# Patient Record
Sex: Male | Born: 1960 | Race: White | Hispanic: No | Marital: Married | Smoking: Never smoker
Health system: Southern US, Community
[De-identification: ages and names within clinical notes are randomized; demographics above are authoritative.]

## PROBLEM LIST (undated history)

## (undated) DIAGNOSIS — I10 Essential (primary) hypertension: Secondary | ICD-10-CM

## (undated) DIAGNOSIS — D509 Iron deficiency anemia, unspecified: Secondary | ICD-10-CM

## (undated) DIAGNOSIS — K2270 Barrett's esophagus without dysplasia: Secondary | ICD-10-CM

## (undated) DIAGNOSIS — R519 Headache, unspecified: Secondary | ICD-10-CM

## (undated) DIAGNOSIS — Z87442 Personal history of urinary calculi: Secondary | ICD-10-CM

## (undated) DIAGNOSIS — K219 Gastro-esophageal reflux disease without esophagitis: Secondary | ICD-10-CM

## (undated) DIAGNOSIS — N2889 Other specified disorders of kidney and ureter: Secondary | ICD-10-CM

## (undated) DIAGNOSIS — M1990 Unspecified osteoarthritis, unspecified site: Secondary | ICD-10-CM

---

## 2000-10-02 HISTORY — PX: KNEE ARTHROSCOPY: SHX127

## 2003-10-03 HISTORY — PX: GASTRIC BYPASS: SHX52

## 2004-07-02 HISTORY — PX: ROUX-EN-Y GASTRIC BYPASS: SHX1104

## 2006-10-02 HISTORY — PX: OTHER SURGICAL HISTORY: SHX169

## 2018-10-02 HISTORY — PX: COLONOSCOPY WITH ESOPHAGOGASTRODUODENOSCOPY (EGD): SHX5779

## 2019-05-28 DIAGNOSIS — Z9884 Bariatric surgery status: Secondary | ICD-10-CM | POA: Insufficient documentation

## 2019-05-28 DIAGNOSIS — D509 Iron deficiency anemia, unspecified: Secondary | ICD-10-CM | POA: Insufficient documentation

## 2019-05-28 DIAGNOSIS — Z8719 Personal history of other diseases of the digestive system: Secondary | ICD-10-CM | POA: Insufficient documentation

## 2019-05-28 DIAGNOSIS — I10 Essential (primary) hypertension: Secondary | ICD-10-CM | POA: Insufficient documentation

## 2019-06-24 LAB — HM COLONOSCOPY

## 2019-12-19 ENCOUNTER — Ambulatory Visit: Payer: Self-pay | Attending: Internal Medicine

## 2019-12-19 DIAGNOSIS — Z23 Encounter for immunization: Secondary | ICD-10-CM

## 2019-12-19 NOTE — Progress Notes (Signed)
   Covid-19 Vaccination Clinic  Name:  Colton Butler    MRN: [id number] DOB: 03/12/1961  12/19/2019  Mr. Colton Butler was observed post Covid-19 immunization for 15 minutes without incident. He was provided with Vaccine Information Sheet and instruction to access the V-Safe system.   Mr. Colton Butler was instructed to call 911 with any severe reactions post vaccine: Marland Kitchen Difficulty breathing  . Swelling of face and throat  . A fast heartbeat  . A bad rash all over body  . Dizziness and weakness   Immunizations Administered    Name Date Dose VIS Date Route   Pfizer COVID-19 Vaccine 12/19/2019  9:50 AM 0.3 mL 09/12/2019 Intramuscular   Manufacturer: [place]   Lot: [id number]   [place]: [id number]

## 2020-01-13 ENCOUNTER — Ambulatory Visit: Payer: Self-pay | Attending: Internal Medicine

## 2020-01-13 DIAGNOSIS — Z23 Encounter for immunization: Secondary | ICD-10-CM

## 2020-01-13 NOTE — Progress Notes (Signed)
   Covid-19 Vaccination Clinic  Name:  Zildjian Gandia    MRN: [id number] DOB: 1960/12/21  01/13/2020  Mr. Leiby was observed post Covid-19 immunization for 15 minutes without incident. He was provided with Vaccine Information Sheet and instruction to access the V-Safe system.   Mr. Seat was instructed to call 911 with any severe reactions post vaccine: Marland Kitchen Difficulty breathing  . Swelling of face and throat  . A fast heartbeat  . A bad rash all over body  . Dizziness and weakness   Immunizations Administered    Name Date Dose VIS Date Route   Pfizer COVID-19 Vaccine 01/13/2020  9:45 AM 0.3 mL 09/12/2019 Intramuscular   Manufacturer: [place]   Lot: [id number]   [place]: [id number]

## 2020-07-07 ENCOUNTER — Ambulatory Visit: Payer: Self-pay

## 2021-10-24 ENCOUNTER — Ambulatory Visit (INDEPENDENT_AMBULATORY_CARE_PROVIDER_SITE_OTHER): Payer: 59

## 2021-10-24 ENCOUNTER — Other Ambulatory Visit: Payer: Self-pay

## 2021-10-24 ENCOUNTER — Ambulatory Visit
Admission: RE | Admit: 2021-10-24 | Discharge: 2021-10-24 | Disposition: A | Discharge status: Home / Self Care | Payer: 59 | Source: Ambulatory Visit

## 2021-10-24 VITALS — BP 150/96 | HR 81 | Temp 98.6°F | Resp 18 | Ht 72.0 in | Wt 285.0 lb

## 2021-10-24 DIAGNOSIS — R059 Cough, unspecified: Secondary | ICD-10-CM | POA: Diagnosis not present

## 2021-10-24 DIAGNOSIS — J40 Bronchitis, not specified as acute or chronic: Secondary | ICD-10-CM

## 2021-10-24 DIAGNOSIS — R0989 Other specified symptoms and signs involving the circulatory and respiratory systems: Secondary | ICD-10-CM | POA: Diagnosis not present

## 2021-10-24 MED ORDER — ALBUTEROL SULFATE HFA 108 (90 BASE) MCG/ACT IN AERS: 2.0000 | INHALATION_SPRAY | RESPIRATORY_TRACT | 0 refills | Status: DC | PRN

## 2021-10-24 MED ORDER — PROMETHAZINE-DM 6.25-15 MG/5ML PO SYRP: 5.0000 mL | ORAL_SOLUTION | Freq: Four times a day (QID) | ORAL | 0 refills | Status: DC | PRN

## 2021-10-24 MED ORDER — PREDNISONE 20 MG PO TABS: 60.0000 mg | ORAL_TABLET | Freq: Every day | ORAL | 0 refills | Status: AC

## 2021-10-24 MED ORDER — AEROCHAMBER MV MISC: 2 refills | Status: DC

## 2021-10-24 MED ORDER — BENZONATATE 100 MG PO CAPS: 200.0000 mg | ORAL_CAPSULE | Freq: Three times a day (TID) | ORAL | 0 refills | Status: DC

## 2021-10-24 NOTE — ED Provider Notes (Signed)
[facility]    CSN: [id number] Arrival date & time: 10/24/21  0844      History   Chief Complaint Chief Complaint  Patient presents with   Cough    9 am appointment    HPI Wai Litt is a 61 y.o. male.   HPI  61 year old male here for evaluation of respiratory complaints.  Patient reports that he has been experiencing a cough for the past 3 weeks that became productive in the last 2 days for a green-yellow sputum.  He also reports that he has wheezing when he lays down, some shortness of breath, fevers that wax and wane to approximately 99, nasal congestion, and decreased appetite.  He is also been experiencing some diarrhea.  Per his wife he was nauseous on the first day but that has largely subsided though she is concerned about his decreased appetite.  History reviewed. No pertinent past medical history.  There are no problems to display for this patient.   Past Surgical History:  Procedure Laterality Date   GASTRIC BYPASS  2005       Home Medications    Prior to Admission medications   Medication Sig Start Date End Date Taking? Authorizing Provider  albuterol (VENTOLIN HFA) 108 (90 Base) MCG/ACT inhaler Inhale 2 puffs into the lungs every 4 (four) hours as needed. 10/24/21  Yes Margarette Canada, NP  benzonatate (TESSALON) 100 MG capsule Take 2 capsules (200 mg total) by mouth every 8 (eight) hours. 10/24/21  Yes Margarette Canada, NP  promethazine-dextromethorphan (PROMETHAZINE-DM) 6.25-15 MG/5ML syrup Take 5 mLs by mouth 4 (four) times daily as needed. 10/24/21  Yes Margarette Canada, NP  Spacer/Aero-Holding Chambers (AEROCHAMBER MV) inhaler Use as instructed 10/24/21  Yes Margarette Canada, NP  lisinopril (ZESTRIL) 20 MG tablet Take 20 mg by mouth daily. 07/25/21   [provider]    Family History History reviewed. No pertinent family history.  Social History Social History   Tobacco Use   Smoking status: Never   Smokeless tobacco: Never  Vaping Use    Vaping Use: Never used  Substance Use Topics   Alcohol use: Not Currently   Drug use: Not Currently     Allergies   Patient has no allergy information on record.   Review of Systems Review of Systems  Constitutional:  Positive for appetite change and fever.  HENT:  Positive for congestion and rhinorrhea. Negative for ear pain and sore throat.   Respiratory:  Positive for cough, shortness of breath and wheezing.   Gastrointestinal:  Positive for diarrhea and nausea. Negative for vomiting.  Skin:  Negative for rash.  Hematological: Negative.   Psychiatric/Behavioral: Negative.      Physical Exam Triage Vital Signs ED Triage Vitals  Enc Vitals Group     BP 10/24/21 0913 (!) 150/96     Pulse Rate 10/24/21 0913 81     Resp 10/24/21 0913 18     Temp 10/24/21 0913 98.6 F (37 C)     Temp Source 10/24/21 0913 Oral     SpO2 10/24/21 0913 98 %     Weight 10/24/21 0911 285 lb (129.3 kg)     Height 10/24/21 0911 6' (1.829 m)     Head Circumference --      Peak Flow --      Pain Score 10/24/21 0911 0     Pain Loc --      Pain Edu? --      Excl. in [place]? --  No data found.  Updated Vital Signs BP (!) 150/96 (BP Location: Left Arm)    Pulse 81    Temp 98.6 F (37 C) (Oral)    Resp 18    Ht 6' (1.829 m)    Wt 285 lb (129.3 kg)    SpO2 98%    BMI 38.65 kg/m   Visual Acuity Right Eye Distance:   Left Eye Distance:   Bilateral Distance:    Right Eye Near:   Left Eye Near:    Bilateral Near:     Physical Exam Vitals and nursing note reviewed.  Constitutional:      General: He is not in acute distress.    Appearance: Normal appearance. He is not ill-appearing.  HENT:     Head: Normocephalic and atraumatic.     Right Ear: Tympanic membrane, ear canal and external ear normal. There is no impacted cerumen.     Left Ear: Tympanic membrane, ear canal and external ear normal. There is no impacted cerumen.     Nose: Congestion present. No rhinorrhea.     Mouth/Throat:      Mouth: Mucous membranes are moist.     Pharynx: Oropharynx is clear. No posterior oropharyngeal erythema.  Cardiovascular:     Rate and Rhythm: Normal rate and regular rhythm.     Pulses: Normal pulses.     Heart sounds: Normal heart sounds. No murmur heard.   No friction rub. No gallop.  Pulmonary:     Effort: Pulmonary effort is normal.     Breath sounds: Wheezing and rhonchi present. No rales.  Musculoskeletal:     Cervical back: Normal range of motion and neck supple.  Lymphadenopathy:     Cervical: No cervical adenopathy.  Skin:    General: Skin is warm and dry.     Capillary Refill: Capillary refill takes less than 2 seconds.     Findings: No erythema or rash.  Neurological:     General: No focal deficit present.     Mental Status: He is alert and oriented to person, place, and time.  Psychiatric:        Mood and Affect: Mood normal.        Behavior: Behavior normal.        Thought Content: Thought content normal.        Judgment: Judgment normal.     UC Treatments / Results  Labs (all labs ordered are listed, but only abnormal results are displayed) Labs Reviewed - No data to display  EKG   Radiology DG Chest 2 View  Result Date: 10/24/2021 CLINICAL DATA:  Cough and chest congestion.  Weeks EXAM: CHEST - 2 VIEW COMPARISON:  None. FINDINGS: The cardiomediastinal silhouette is within normal limits. No airspace consolidation, edema, pleural effusion, or pneumothorax is identified. No acute osseous abnormality is seen. IMPRESSION: No active cardiopulmonary disease. Electronically Signed   By: Logan Bores M.D.   On: 10/24/2021 10:58    Procedures Procedures (including critical care time)  Medications Ordered in UC Medications - No data to display  Initial Impression / Assessment and Plan / UC Course  I have reviewed the triage vital signs and the nursing notes.  Pertinent labs & imaging results that were available during my care of the patient were reviewed by me  and considered in my medical decision making (see chart for details).  Patient is a very pleasant, nontoxic appearing 61 year old male here for evaluation of 3 weeks worth of respiratory complaints.  He  does endorse some nasal congestion but his primary complaint is a cough that has been present for 3 weeks with shortness of breath and wheezing when he lays down.  2 days ago he started to produce sputum that is green to yellow in color.  During the course of his illness he has also been experiencing waxing and waning fevers but reports that the maximum measured temp at home was 99.  He also experienced nausea on the first day, has had some ongoing diarrhea, and has had a decreased appetite.  Patient's wife is most worried about the appetite and reports that she is only been able to get him to eat eat more than soup in the past 2 days.  Patient's physical exam reveals pearly gray tympanic membranes bilaterally with normal light reflex and clear external auditory canals.  Nasal mucosa is mildly edematous and erythematous without any significant discharge.  Oropharyngeal exam is benign.  No cervical lymphadenopathy present on exam.  Cardiopulmonary exam reveals rhonchi in bilateral upper lung fields.  The remainder the right lung field is clear but patient does have wheezes and rhonchi in the left lower lobe posteriorly.  Patient also has a prolonged expiratory phase.  Patient was tested at home x3 for COVID and all the tests were negative.  I explained to the patient and his wife that there is a chance that he could have had a false negative test but he is beyond the window for therapeutic intervention and he is no longer contagious.  My concern is a secondary bacterial infection and possible pneumonia.  Will obtain chest x-ray to look for acute cardiothoracic process.  Chest x-ray independently reviewed and evaluated by me.  Impression: Lung fields are well pneumatized.  There is questionable patchiness in the  left lower lobe.  Pulmonary vasculature is prominent.  Radiology impression is pending.  Radiology impression states cardiomediastinal silhouette is within normal limits.  No airspace consolidation, edema, pleural effusion, or pneumothorax is noted.  No osseous abnormality.  No active cardiopulmonary disease.  Suspect patient has developed bronchitis secondary to his upper respiratory infection that involves the past 3 weeks.  We will treat patient with an albuterol inhaler to help with wheezing and shortness of breath, prednisone, Tessalon Perles, and Promethazine DM cough syrup.   Final Clinical Impressions(s) / UC Diagnoses   Final diagnoses:  Bronchitis     Discharge Instructions      Use the albuterol inhaler/nebulizer every 4-6 hours as needed for shortness of breath, wheezing, and cough.  Take the prednisone according to the package instructions to help with pulmonary inflammation.  Use the Tessalon Perles every 8 hours for your cough.  Taken with a small sip of water.  They may give you some numbness to the base of your tongue or metallic taste in her mouth, this is normal.  They are designed to calm down the cough reflex.  Use the Promethazine DM cough syrup at bedtime as will make you drowsy.  You may take 1 teaspoon (5 mL) every 6 hours.  Return for reevaluation for new or worsening symptoms.      ED Prescriptions     Medication Sig Dispense Auth. Provider   albuterol (VENTOLIN HFA) 108 (90 Base) MCG/ACT inhaler Inhale 2 puffs into the lungs every 4 (four) hours as needed. 18 g Margarette Canada, NP   Spacer/Aero-Holding Chambers (AEROCHAMBER MV) inhaler Use as instructed 1 each Margarette Canada, NP   benzonatate (TESSALON) 100 MG capsule Take 2 capsules (200  mg total) by mouth every 8 (eight) hours. 21 capsule Margarette Canada, NP   promethazine-dextromethorphan (PROMETHAZINE-DM) 6.25-15 MG/5ML syrup Take 5 mLs by mouth 4 (four) times daily as needed. 118 mL Margarette Canada, NP       PDMP not reviewed this encounter.   Margarette Canada, NP 10/24/21 1143

## 2021-10-24 NOTE — ED Triage Notes (Signed)
Pt here with C/O chest congestion for 3 weeks. Just became a productive cough a couple days ago, pt states he is wheezing when laying on the left side.

## 2021-10-24 NOTE — Discharge Instructions (Addendum)
Use the albuterol inhaler/nebulizer every 4-6 hours as needed for shortness of breath, wheezing, and cough.  Take the prednisone according to the package instructions to help with pulmonary inflammation.  Use the Tessalon Perles every 8 hours for your cough.  Taken with a small sip of water.  They may give you some numbness to the base of your tongue or metallic taste in her mouth, this is normal.  They are designed to calm down the cough reflex.  Use the Promethazine DM cough syrup at bedtime as will make you drowsy.  You may take 1 teaspoon (5 mL) every 6 hours.  Return for reevaluation for new or worsening symptoms.  

## 2022-01-29 ENCOUNTER — Emergency Department
Admission: EM | Admit: 2022-01-29 | Discharge: 2022-01-30 | Disposition: A | Discharge status: Home / Self Care | Payer: 59 | Attending: Emergency Medicine | Admitting: Emergency Medicine

## 2022-01-29 ENCOUNTER — Encounter: Payer: Self-pay | Admitting: Emergency Medicine

## 2022-01-29 ENCOUNTER — Emergency Department: Payer: 59

## 2022-01-29 DIAGNOSIS — N201 Calculus of ureter: Secondary | ICD-10-CM | POA: Insufficient documentation

## 2022-01-29 DIAGNOSIS — I10 Essential (primary) hypertension: Secondary | ICD-10-CM | POA: Insufficient documentation

## 2022-01-29 DIAGNOSIS — N23 Unspecified renal colic: Secondary | ICD-10-CM

## 2022-01-29 DIAGNOSIS — R339 Retention of urine, unspecified: Secondary | ICD-10-CM | POA: Diagnosis present

## 2022-01-29 HISTORY — DX: Essential (primary) hypertension: I10

## 2022-01-29 LAB — CBC WITH DIFFERENTIAL/PLATELET
Abs Immature Granulocytes: 0.02 10*3/uL (ref 0.00–0.07)
Basophils Absolute: 0.1 10*3/uL (ref 0.0–0.1)
Basophils Relative: 1 %
Eosinophils Absolute: 0.3 10*3/uL (ref 0.0–0.5)
Eosinophils Relative: 4 %
HCT: 44.8 % (ref 39.0–52.0)
Hemoglobin: 15.5 g/dL (ref 13.0–17.0)
Immature Granulocytes: 0 %
Lymphocytes Relative: 42 %
Lymphs Abs: 4.1 10*3/uL — ABNORMAL HIGH (ref 0.7–4.0)
MCH: 32.1 pg (ref 26.0–34.0)
MCHC: 34.6 g/dL (ref 30.0–36.0)
MCV: 92.8 fL (ref 80.0–100.0)
Monocytes Absolute: 1 10*3/uL (ref 0.1–1.0)
Monocytes Relative: 10 %
Neutro Abs: 4.3 10*3/uL (ref 1.7–7.7)
Neutrophils Relative %: 43 %
Platelets: 290 10*3/uL (ref 150–400)
RBC: 4.83 MIL/uL (ref 4.22–5.81)
RDW: 12.4 % (ref 11.5–15.5)
WBC: 9.7 10*3/uL (ref 4.0–10.5)
nRBC: 0 % (ref 0.0–0.2)

## 2022-01-29 LAB — URINALYSIS, ROUTINE W REFLEX MICROSCOPIC
Bilirubin Urine: NEGATIVE
Glucose, UA: NEGATIVE mg/dL
Hgb urine dipstick: NEGATIVE
Ketones, ur: NEGATIVE mg/dL
Leukocytes,Ua: NEGATIVE
Nitrite: NEGATIVE
Protein, ur: NEGATIVE mg/dL
Specific Gravity, Urine: 1.015 (ref 1.005–1.030)
pH: 5 (ref 5.0–8.0)

## 2022-01-29 LAB — COMPREHENSIVE METABOLIC PANEL
ALT: 24 U/L (ref 0–44)
AST: 30 U/L (ref 15–41)
Albumin: 4.7 g/dL (ref 3.5–5.0)
Alkaline Phosphatase: 79 U/L (ref 38–126)
Anion gap: 10 (ref 5–15)
BUN: 17 mg/dL (ref 8–23)
CO2: 21 mmol/L — ABNORMAL LOW (ref 22–32)
Calcium: 9.7 mg/dL (ref 8.9–10.3)
Chloride: 103 mmol/L (ref 98–111)
Creatinine, Ser: 1.22 mg/dL (ref 0.61–1.24)
GFR, Estimated: >60 mL/min (ref >60)
Glucose, Bld: 106 mg/dL — ABNORMAL HIGH (ref 70–99)
Potassium: 4 mmol/L (ref 3.5–5.1)
Sodium: 134 mmol/L — ABNORMAL LOW (ref 135–145)
Total Bilirubin: 0.8 mg/dL (ref 0.3–1.2)
Total Protein: 8.2 g/dL — ABNORMAL HIGH (ref 6.5–8.1)

## 2022-01-29 MED ORDER — KETOROLAC TROMETHAMINE 30 MG/ML IJ SOLN: 30.0000 mg | Freq: Once | INTRAMUSCULAR | Status: DC

## 2022-01-29 MED ORDER — KETOROLAC TROMETHAMINE 30 MG/ML IJ SOLN
15.0000 mg | Freq: Once | INTRAMUSCULAR | Status: AC
  Administered 2022-01-29: 15 mg via INTRAVENOUS

## 2022-01-29 MED ORDER — LACTATED RINGERS IV BOLUS
1000.0000 mL | Freq: Once | INTRAVENOUS | Status: AC
  Administered 2022-01-29: 1000 mL via INTRAVENOUS

## 2022-01-29 MED ORDER — HYDROMORPHONE HCL 1 MG/ML IJ SOLN
1.0000 mg | Freq: Once | INTRAMUSCULAR | Status: AC
  Administered 2022-01-29: 1 mg via INTRAVENOUS
  Filled 2022-01-29: qty 1

## 2022-01-29 MED ORDER — KETOROLAC TROMETHAMINE 15 MG/ML IJ SOLN
INTRAMUSCULAR | Status: AC
  Filled 2022-01-29: qty 1

## 2022-01-29 NOTE — ED Provider Notes (Signed)
? ?[employer] ?Provider Note ? ? ? Event Date/Time  ? First MD Initiated Contact with Patient 01/29/22 2102   ?  (approximate) ? ? ?History  ? ?Urinary Retention ? ? ?HPI ? ?Colton Butler is a 61 y.o. male who presents to the ED for evaluation of Urinary Retention ?  ?Patient self-reports a history of hypertension, GERD and s/p Roux-en-Y gastric bypass remotely. ? ?He presents with his wife for evaluation of suprapubic and left-sided flank pain in the setting of difficulty voiding.  He reports frequently having urinary incontinence that he attributes to getting older, but has not ever had retention, prostate issues or need for a catheter.  Reports symptoms just developing this afternoon, since about 2 PM.  Denies any dysuria, urethral discharge.  Reports just dribbling urine when he tries to void.  Has never had a kidney stone. ? ? ?Physical Exam  ? ?Triage Vital Signs: ?ED Triage Vitals  ?Enc Vitals Group  ?   BP 01/29/22 2050 (!) 147/119  ?   Pulse Rate 01/29/22 2050 (!) 101  ?   Resp 01/29/22 2050 18  ?   Temp 01/29/22 2050 98.4 ?F (36.9 ?C)  ?   Temp src --   ?   SpO2 01/29/22 2050 95 %  ?   Weight 01/29/22 2050 300 lb (136.1 kg)  ?   Height 01/29/22 2050 6' (1.829 m)  ?   Head Circumference --   ?   Peak Flow --   ?   Pain Score 01/29/22 2050 6  ?   Pain Loc --   ?   Pain Edu? --   ?   Excl. in [place]? --   ? ? ?Most recent vital signs: ?Vitals:  ? 01/29/22 2050  ?BP: (!) 147/119  ?Pulse: (!) 101  ?Resp: 18  ?Temp: 98.4 ?F (36.9 ?C)  ?SpO2: 95%  ? ? ?General: Awake, no distress.  Obese.  Walks back from the bathroom where he just tried to void, seemed uncomfortable.  Sitting up in the side of the bed.  Left CVA tenderness is present. ?CV:  Good peripheral perfusion.  ?Resp:  Normal effort.  ?Abd:  No distention.  Suprapubic tenderness and left-sided CVA tenderness is present.  Benign upper abdomen. ?MSK:  No deformity noted.  ?Neuro:  No focal deficits appreciated. ?Other:   ? ? ?ED Results /  Procedures / Treatments  ? ?Labs ?(all labs ordered are listed, but only abnormal results are displayed) ?Labs Reviewed  ?URINALYSIS, ROUTINE W REFLEX MICROSCOPIC - Abnormal; Notable for the following components:  ?    Result Value  ? Color, Urine YELLOW (*)   ? APPearance CLEAR (*)   ? All other components within normal limits  ?COMPREHENSIVE METABOLIC PANEL - Abnormal; Notable for the following components:  ? Sodium 134 (*)   ? CO2 21 (*)   ? Glucose, Bld 106 (*)   ? Total Protein 8.2 (*)   ? All other components within normal limits  ?CBC WITH DIFFERENTIAL/PLATELET - Abnormal; Notable for the following components:  ? Lymphs Abs 4.1 (*)   ? All other components within normal limits  ? ? ?EKG ? ? ?RADIOLOGY ?CT abdomen/pelvis reviewed by me with thickened bladder wall, left perinephric stranding, left proximal hydroureter without ureteral stone ? ?Official radiology report(s): ?CT ABDOMEN PELVIS WO CONTRAST ? ?Result Date: 01/29/2022 ?CLINICAL DATA:  Left flank pain, suprapubic pain EXAM: CT ABDOMEN AND PELVIS WITHOUT CONTRAST TECHNIQUE: Multidetector CT imaging of  the abdomen and pelvis was performed following the standard protocol without IV contrast. RADIATION DOSE REDUCTION: This exam was performed according to the departmental dose-optimization program which includes automated exposure control, adjustment of the mA and/or kV according to patient size and/or use of iterative reconstruction technique. COMPARISON:  None. FINDINGS: Lower chest: Small linear densities in the lower lung fields may suggest minimal scarring or subsegmental atelectasis. Hepatobiliary: Liver measures 20.7 cm in length. No focal abnormality is seen. There are multiple calcified gallbladder stones. There is no wall thickening in gallbladder. Pancreas: No focal abnormality is seen. Spleen: Unremarkable. Adrenals/Urinary Tract: Adrenals are unremarkable. There is no hydronephrosis in the right kidney. There is mild to moderate left  hydronephrosis. There is dilation of left ureter. There is no identifiable opaque left ureteral stone. Findings may suggest recent passage of left ureteral stone. Another less likely possibility would be left vesical renal reflux. There are no demonstrable renal stones. There is 1.3 cm exophytic lesion in the lower pole of left kidney. Density measurements are higher than usual for simple cyst. There is possible subcentimeter cyst in the upper pole of right kidney. There is mild diffuse wall thickening in the bladder. There is no definite opaque calculus in the bladder lumen. Stomach/Bowel: Surgical staples are seen in the stomach. Small hiatal hernia is seen. Small bowel loops are not dilated. Appendix is not dilated. There is no significant wall thickening in the colon. There is no pericolic stranding. Vascular/Lymphatic: Scattered arterial calcifications are seen. Reproductive: Unremarkable. Other: There is no ascites or pneumoperitoneum. Musculoskeletal: Unremarkable. IMPRESSION: There is mild to moderate left hydronephrosis. There is left hydroureter. There is no demonstrable opaque calculus in the course of left ureter and in the lumen of urinary bladder. Findings may suggest recent passage of left ureteral calculus. Other less likely possibilities would include nonopaque ureteral calculus or stricture at the left ureterovesical junction or vesicorenal reflux. There is 1.3 cm exophytic lesion in the lower pole of left kidney with density measurements higher than usual for simple cyst. This may be cyst with hemorrhagic or proteinaceous fluid or solid lesion. Follow-up renal sonogram may be considered. There are no identifiable renal stones. There is no evidence of intestinal obstruction or pneumoperitoneum. Appendix is not dilated. Enlarged liver. Gallbladder stones. Previous gastric bypass. Small hiatal hernia. Electronically Signed   By: Elmer Picker M.D.   On: 01/29/2022 21:58   ? ?PROCEDURES and  INTERVENTIONS: ? ?Procedures ? ?Medications  ?ketorolac (TORADOL) 15 MG/ML injection (has no administration in time range)  ?HYDROmorphone (DILAUDID) injection 1 mg (1 mg Intravenous Given 01/29/22 2134)  ?lactated ringers bolus 1,000 mL (1,000 mLs Intravenous New Bag/Given 01/29/22 2203)  ?ketorolac (TORADOL) 30 MG/ML injection 15 mg (15 mg Intravenous Given 01/29/22 2225)  ? ? ? ?IMPRESSION / MDM / ASSESSMENT AND PLAN / ED COURSE  ?I reviewed the triage vital signs and the nursing notes. ? ?61 year old male presents to the ED with urinary hesitancy, difficulty voiding and left-sided pain, likely ureteral colic from a ureteral stone.  He is not retaining any significant urine and does not require Foley catheter placement.  He appears quite uncomfortable on presentation and clinically does fit the picture of a kidney stone.  Blood work with normal CBC, minimal metabolic acidosis is noted, but intact renal function on his metabolic panel.  Urine without infectious features or hematuria.  Provide IV fluids and analgesia, CT abdomen/pelvis with no clear signs of the etiology of his pain.  With the partial hydroureter  and his clinical syndrome, I do suspect that we just missed a ureteral stone with this image.  He certainly fits the clinical picture of this.  No other signs of acute intra-abdominal pathology such as SBO, diverticulitis.  Briefly considered CTA abdomen/pelvis to evaluate for ischemic bowel, but this is less likely.  We will subsequently perform p.o. challenge and anticipate outpatient management. ? ?Clinical Course as of 01/29/22 2324  ?Sun Jan 29, 2022  ?2130 Bladder scan only has 101 mL on multiple attempts. [DS]  ?2221 Reassessed.  Still very uncomfortable he is clinically acting like a kidney stone, even though 1 was not seen on his CT scan.  We will try Toradol [DS]  ?2245 Reassessed.  He reports feeling "so much better" and is lying still and is thankful. [DS]  ?2324 Reassessed.  Still feeling well.   We discussed likely kidney stone, but some limitations off of the CT.  We discussed dietary and hydration effects of a possible kidney stone.  We will perform p.o. challenge and hopefully discharge home as long as h

## 2022-01-29 NOTE — Discharge Instructions (Signed)
Use Tylenol for pain and fevers.  Up to 1000 mg per dose, up to 4 times per day.  Do not take more than 4000 mg of Tylenol/acetaminophen within 24 hours..  

## 2022-01-29 NOTE — ED Triage Notes (Signed)
Pt presents via POV with complaints of urinary retention - he endorses mild abdominal discomfort & left sided flank pain as a result from the retention. He notes that this started around 1600 today. Denies CP or SOB.   ?

## 2022-01-30 ENCOUNTER — Ambulatory Visit: Payer: Self-pay

## 2022-02-01 ENCOUNTER — Encounter: Payer: Self-pay | Admitting: Urology

## 2022-02-01 ENCOUNTER — Other Ambulatory Visit
Admission: RE | Admit: 2022-02-01 | Discharge: 2022-02-01 | Disposition: A | Discharge status: Home / Self Care | Payer: 59 | Attending: Urology | Admitting: Urology

## 2022-02-01 ENCOUNTER — Other Ambulatory Visit: Payer: Self-pay | Admitting: *Deleted

## 2022-02-01 ENCOUNTER — Ambulatory Visit (INDEPENDENT_AMBULATORY_CARE_PROVIDER_SITE_OTHER): Payer: 59 | Admitting: Urology

## 2022-02-01 VITALS — BP 120/83 | HR 82 | Ht 71.0 in | Wt 304.0 lb

## 2022-02-01 DIAGNOSIS — N200 Calculus of kidney: Secondary | ICD-10-CM | POA: Insufficient documentation

## 2022-02-01 DIAGNOSIS — Z87442 Personal history of urinary calculi: Secondary | ICD-10-CM | POA: Diagnosis not present

## 2022-02-01 DIAGNOSIS — R3129 Other microscopic hematuria: Secondary | ICD-10-CM

## 2022-02-01 DIAGNOSIS — N2889 Other specified disorders of kidney and ureter: Secondary | ICD-10-CM | POA: Diagnosis not present

## 2022-02-01 DIAGNOSIS — N281 Cyst of kidney, acquired: Secondary | ICD-10-CM

## 2022-02-01 LAB — URINALYSIS, COMPLETE (UACMP) WITH MICROSCOPIC
Bilirubin Urine: NEGATIVE
Glucose, UA: NEGATIVE mg/dL
Ketones, ur: 15 mg/dL — AB
Leukocytes,Ua: NEGATIVE
Nitrite: NEGATIVE
Protein, ur: NEGATIVE mg/dL
Specific Gravity, Urine: 1.01 (ref 1.005–1.030)
pH: 5.5 (ref 5.0–8.0)

## 2022-02-01 LAB — BLADDER SCAN AMB NON-IMAGING

## 2022-02-01 NOTE — Patient Instructions (Signed)
Dietary Guidelines to Help Prevent Kidney Stones Kidney stones are deposits of minerals and salts that form inside your kidneys. Your risk of developing kidney stones may be greater depending on your diet, your lifestyle, the medicines you take, and whether you have certain medical conditions. Most people can lower their chances of developing kidney stones by following the instructions below. Your dietitian may give you more specific instructions depending on your overall health and the type of kidney stones you tend to develop. What are tips for following this plan? Reading food labels  Choose foods with "no salt added" or "low-salt" labels. Limit your salt (sodium) intake to less than 1,500 mg a day. Choose foods with calcium for each meal and snack. Try to eat about 300 mg of calcium at each meal. Foods that contain 200-500 mg of calcium a serving include: 8 oz (237 mL) of milk, calcium-fortifiednon-dairy milk, and calcium-fortifiedfruit juice. Calcium-fortified means that calcium has been added to these drinks. 8 oz (237 mL) of kefir, yogurt, and soy yogurt. 4 oz (114 g) of tofu. 1 oz (28 g) of cheese. 1 cup (150 g) of dried figs. 1 cup (91 g) of cooked broccoli. One 3 oz (85 g) can of sardines or mackerel. Most people need 1,000-1,500 mg of calcium a day. Talk to your dietitian about how much calcium is recommended for you. Shopping Buy plenty of fresh fruits and vegetables. Most people do not need to avoid fruits and vegetables, even if these foods contain nutrients that may contribute to kidney stones. When shopping for convenience foods, choose: Whole pieces of fruit. Pre-made salads with dressing on the side. Low-fat fruit and yogurt smoothies. Avoid buying frozen meals or prepared deli foods. These can be high in sodium. Look for foods with live cultures, such as yogurt and kefir. Choose high-fiber grains, such as whole-wheat breads, oat bran, and wheat cereals. Cooking Do not add  salt to food when cooking. Place a salt shaker on the table and allow each person to add his or her own salt to taste. Use vegetable protein, such as beans, textured vegetable protein (TVP), or tofu, instead of meat in pasta, casseroles, and soups. Meal planning Eat less salt, if told by your dietitian. To do this: Avoid eating processed or pre-made food. Avoid eating fast food. Eat less animal protein, including cheese, meat, poultry, or fish, if told by your dietitian. To do this: Limit the number of times you have meat, poultry, fish, or cheese each week. Eat a diet free of meat at least 2 days a week. Eat only one serving each day of meat, poultry, fish, or seafood. When you prepare animal protein, cut pieces into small portion sizes. For most meat and fish, one serving is about the size of the palm of your hand. Eat at least five servings of fresh fruits and vegetables each day. To do this: Keep fruits and vegetables on hand for snacks. Eat one piece of fruit or a handful of berries with breakfast. Have a salad and fruit at lunch. Have two kinds of vegetables at dinner. Limit foods that are high in a substance called oxalate. These include: Spinach (cooked), rhubarb, beets, sweet potatoes, and Swiss chard. Peanuts. Potato chips, french fries, and baked potatoes with skin on. Nuts and nut products. Chocolate. If you regularly take a diuretic medicine, make sure to eat at least 1 or 2 servings of fruits or vegetables that are high in potassium each day. These include: Avocado. Banana. Orange, prune,   carrot, or tomato juice. Baked potato. Cabbage. Beans and split peas. Lifestyle  Drink enough fluid to keep your urine pale yellow. This is the most important thing you can do. Spread your fluid intake throughout the day. If you drink alcohol: Limit how much you use to: 0-1 drink a day for women who are not pregnant. 0-2 drinks a day for men. Be aware of how much alcohol is in your  drink. In the [place] one drink equals one 12 oz bottle of beer (355 mL), one 5 oz glass of wine (148 mL), or one 1 oz glass of hard liquor (44 mL). Lose weight if told by your health care provider. Work with your dietitian to find an eating plan and weight loss strategies that work best for you. General information Talk to your health care provider and dietitian about taking daily supplements. You may be told the following depending on your health and the cause of your kidney stones: Not to take supplements with vitamin C. To take a calcium supplement. To take a daily probiotic supplement. To take other supplements such as magnesium, fish oil, or vitamin B6. Take over-the-counter and prescription medicines only as told by your health care provider. These include supplements. What foods should I limit? Limit your intake of the following foods, or eat them as told by your dietitian. Vegetables Spinach. Rhubarb. Beets. Canned vegetables. Pickles. Olives. Baked potatoes with skin. Grains Wheat bran. Baked goods. Salted crackers. Cereals high in sugar. Meats and other proteins Nuts. Nut butters. Large portions of meat, poultry, or fish. Salted, precooked, or cured meats, such as sausages, meat loaves, and hot dogs. Dairy Cheese. Beverages Regular soft drinks. Regular vegetable juice. Seasonings and condiments Seasoning blends with salt. Salad dressings. Soy sauce. Ketchup. Barbecue sauce. Other foods Canned soups. Canned pasta sauce. Casseroles. Pizza. Lasagna. Frozen meals. Potato chips. French fries. The items listed above may not be a complete list of foods and beverages you should limit. Contact a dietitian for more information. What foods should I avoid? Talk to your dietitian about specific foods you should avoid based on the type of kidney stones you have and your overall health. Fruits Grapefruit. The item listed above may not be a complete list of foods and beverages you should  avoid. Contact a dietitian for more information. Summary Kidney stones are deposits of minerals and salts that form inside your kidneys. You can lower your risk of kidney stones by making changes to your diet. The most important thing you can do is drink enough fluid. Drink enough fluid to keep your urine pale yellow. Talk to your dietitian about how much calcium you should have each day, and eat less salt and animal protein as told by your dietitian. This information is not intended to replace advice given to you by your health care provider. Make sure you discuss any questions you have with your health care provider. Document Revised: 05/30/2021 Document Reviewed: 05/30/2021 Elsevier Patient Education  2023 Elsevier Inc.  

## 2022-02-01 NOTE — Progress Notes (Signed)
? ?  02/01/22 ?12:28 PM  ? ?Colton Butler ?02-Mar-1961 ?[id number] ? ?CC: Urinary symptoms, left hydronephrosis, left renal lesion ? ?HPI: ?61 year old male with history of gastric bypass who was seen in the ER on 01/29/2022 with acute onset of urinary symptoms and sensation of incomplete emptying as well as left-sided flank pain.  Work-up with CT scan showed mild left hydroureteronephrosis down to the bladder, but no evidence of ureteral or renal stones.  There was also a small 1.3 cm left-sided lower pole exophytic lesion incompletely evaluated but density higher than a typical simple cyst.  He denies any gross hematuria.  He had complete resolution of his symptoms within a few hours of being in the ER, and it was felt that he may have passed a ureteral stone.  He denies a history of kidney stones. ? ?He really denies any complaints today and feels well.  PVR is normal at 0 mL, UA notable for microscopic hematuria with 6-10 RBCs but otherwise benign. ? ? ?PMH: ?Past Medical History:  ?Diagnosis Date  ? Hypertension   ? ? ?Surgical History: ?Past Surgical History:  ?Procedure Laterality Date  ? GASTRIC BYPASS  2005  ? ? ?Family History: ?No family history on file. ? ?Social History:  reports that he has never smoked. He has never been exposed to tobacco smoke. He has never used smokeless tobacco. He reports that he does not drink alcohol and does not use drugs. ? ?Physical Exam: ?BP 120/83   Pulse 82   Ht '5\' 11"'$  (1.803 m)   Wt (!) 304 lb (137.9 kg)   BMI 42.40 kg/m?   ? ?Constitutional:  Alert and oriented, No acute distress. ?Cardiovascular: No clubbing, cyanosis, or edema. ?Respiratory: Normal respiratory effort, no increased work of breathing. ?GI: Abdomen is soft, nontender, nondistended, no abdominal masses ? ? ?Laboratory Data: ?Reviewed ? ?Pertinent Imaging: ?I have personally viewed and interpreted the CT showing left mild hydroureteronephrosis down to the bladder with no evidence of ureteral or renal  stones. ? ?Assessment & Plan:   ?61 year old male with recent ER visit for left-sided flank pain and urinary symptoms with clinical picture and imaging consistent with a recently passed left ureteral stone.  There was some persistent mild left-sided hydronephrosis, but his pain completely resolved within a few hours of the CT scan.  We discussed the small left renal lesion that requires further follow-up with a renal ultrasound, and with his microscopic hematuria today on UA I recommended a repeat urine in 1 month to confirm resolution, and consider cystoscopy if persistent microscopic hematuria at that time. ? ?Regarding his history of gastric bypass, we discussed stone prevention strategies including adequate hydration, low-salt diet, avoiding high oxalate foods, and taking in calcium with all meals to prevent calcium oxalate stones. ? ?RTC 4 to 6 weeks with UA and renal ultrasound prior ? ?Nickolas Madrid, MD ?02/01/2022 ? ?[facility] ?[address] ?[place], [place] [address] ?[phone] ? ? ?

## 2022-02-03 ENCOUNTER — Ambulatory Visit
Admission: RE | Admit: 2022-02-03 | Discharge: 2022-02-03 | Disposition: A | Discharge status: Home / Self Care | Payer: 59 | Source: Ambulatory Visit | Attending: Internal Medicine | Admitting: Internal Medicine

## 2022-02-03 ENCOUNTER — Telehealth: Payer: Self-pay

## 2022-02-03 ENCOUNTER — Encounter (HOSPITAL_COMMUNITY): Payer: Self-pay

## 2022-02-03 VITALS — BP 143/103 | HR 80 | Temp 98.5°F | Resp 20 | Ht 71.0 in | Wt 300.0 lb

## 2022-02-03 DIAGNOSIS — I10 Essential (primary) hypertension: Secondary | ICD-10-CM

## 2022-02-03 HISTORY — DX: Gastro-esophageal reflux disease without esophagitis: K21.9

## 2022-02-03 MED ORDER — LISINOPRIL 20 MG PO TABS: 20.0000 mg | ORAL_TABLET | Freq: Every day | ORAL | 0 refills | Status: DC

## 2022-02-03 NOTE — ED Triage Notes (Signed)
Patient is here for "BP Medication refill". Currently in transition finding a PCP (last retired). Currently Last BP: 120/82 yesterday. Recently seen in ED for Kidney stones. No ha. No nausea. No chest pain. No sob.  ?

## 2022-02-03 NOTE — Telephone Encounter (Signed)
Called to confirm patients reservations/appt this morning for "BP medication refill". Current BP's "are runnying 120's/80's". PCP "recently retired", On the search for PCP. Patient will be in this morning.  ? ?B. Roten CMA ?

## 2022-02-03 NOTE — ED Provider Notes (Signed)
?[facility] ? ? ? ?CSN: [id number] ?Arrival date & time: 02/03/22  0630 ? ? ?  ? ?History   ?Chief Complaint ?Chief Complaint  ?Patient presents with  ? Hypertension  ?  Do not have Primary care, need refill of high blood pressure medication - Entered by patient  ? Medication Refill  ? ? ?HPI ?Colton Butler is a 61 y.o. male here requesting BP medication refill. He travels and is in town about 6 months of the year, and his wife is trying to find him a PCP. He has gained 35 pounds and had been off this medication, but back on it. He is working on wt loss again. He denies CP, SOB, HA. Was in the hospital last week due to kidney stone and had labs done then.  ? ? ? ?Past Medical History:  ?Diagnosis Date  ? GERD (gastroesophageal reflux disease)   ? Hypertension   ? ? ?Patient Active Problem List  ? Diagnosis Date Noted  ? Microcytic anemia 05/28/2019  ? Essential hypertension 05/28/2019  ? History of Barrett's esophagus 05/28/2019  ? History of bariatric surgery 05/28/2019  ? ? ?Past Surgical History:  ?Procedure Laterality Date  ? GASTRIC BYPASS  2005  ? ? ? ? ? ?Home Medications   ? ?Prior to Admission medications   ?Medication Sig Start Date End Date Taking? Authorizing Provider  ?CALCIUM CITRATE PO calcium citrate   Yes [provider]  ?cetirizine (ZYRTEC) 10 MG tablet Take 10 mg by mouth daily.   Yes [provider]  ?Cyanocobalamin (B-12) 500 MCG SUBL Vitamin B12 500 mcg tablet ? Take by oral route.   Yes [provider]  ?Multiple Vitamins-Minerals (CENTRUM SILVER PO) Centrum Silver   Yes [provider]  ?omeprazole (PRILOSEC) 20 MG capsule omeprazole 20 mg capsule,delayed release ? Take 1 capsule every day by oral route.   Yes [provider]  ?lisinopril (ZESTRIL) 20 MG tablet Take 1 tablet (20 mg total) by mouth daily. 02/03/22   Rodriguez-Southworth, Sunday Spillers, PA-C  ? ? ?Family History ?No family history on file. ? ?Social History ?Social History  ? ?Tobacco  Use  ? Smoking status: Never  ?  Passive exposure: Never  ? Smokeless tobacco: Never  ?Vaping Use  ? Vaping Use: Never used  ?Substance Use Topics  ? Alcohol use: Never  ? Drug use: Never  ? ? ? ?Allergies   ?Patient has no known allergies. ? ? ?Review of Systems ? ?Constitutional: Negative for diaphoresis and unexpected weight change.  ?HENT: Negative for tinnitus.   ?Eyes: Negative for visual disturbance.  ?Respiratory: Negative for chest tightness and shortness of breath.   ?Cardiovascular: Negative for chest pain, palpitations and leg swelling.  ?Gastrointestinal: Negative for constipation, diarrhea and nausea.  ?Endocrine: Negative for polydipsia, polyphagia and polyuria.  ?Genitourinary: Negative for dysuria and frequency.  ?Skin: Negative for rash and wound.  ?Neurological: Negative for dizziness, speech difficulty, weakness, numbness and headaches.   ? ?Physical Exam ?Triage Vital Signs ?ED Triage Vitals  ?Enc Vitals Group  ?   BP 02/03/22 0903 (!) 146/103  ?   Pulse Rate 02/03/22 0903 80  ?   Resp 02/03/22 0903 20  ?   Temp 02/03/22 0903 98.5 ?F (36.9 ?C)  ?   Temp Source 02/03/22 0903 Oral  ?   SpO2 02/03/22 0903 98 %  ?   Weight 02/03/22 0858 300 lb (136.1 kg)  ?   Height 02/03/22 0858 '5\' 11"'$  (1.803 m)  ?  Head Circumference --   ?   Peak Flow --   ?   Pain Score --   ?   Pain Loc --   ?   Pain Edu? --   ?   Excl. in [place]? --   ? ?No data found. ? ?Updated Vital Signs ?BP (!) 143/103 (BP Location: Left Arm)   Pulse 80   Temp 98.5 ?F (36.9 ?C) (Oral)   Resp 20   Ht '5\' 11"'$  (1.803 m)   Wt 300 lb (136.1 kg)   SpO2 98%   BMI 41.84 kg/m?  ? ?Visual Acuity ?Right Eye Distance:   ?Left Eye Distance:   ?Bilateral Distance:   ? ?Right Eye Near:   ?Left Eye Near:    ?Bilateral Near:    ? ?Physical Exam ? ?Constitutional: he is oriented to person, place, and time. He appears well-developed and well-nourished. No distress.  ?HENT:  ?Head: Normocephalic and atraumatic.  ?Right Ear: External ear normal.  ?Left  Ear: External ear normal.  ?Nose: Nose normal.  ?Eyes: Conjunctivae are normal. Right eye exhibits no discharge. Left eye exhibits no discharge. No scleral icterus.  ?Neck: Neck supple. No thyromegaly present.  ?No carotid bruits bilaterally  ?Cardiovascular: Normal rate and regular rhythm.  ?No murmur heard. ?Pulmonary/Chest: Effort normal and breath sounds normal. No respiratory distress.  ?Musculoskeletal: Normal range of motion. He exhibits no edema.  ?Lymphadenopathy: he has no cervical adenopathy.  ?Neurological: he is alert and oriented to person, place, and time. Denies snoring.  ?Skin: Skin is warm and dry. Capillary refill takes less than 2 seconds. No rash noted. He is not diaphoretic.  ?Psychiatric: he has a normal mood and affect. His behavior is normal. Judgment and thought content normal.  ?Nursing note reviewed. ? ? ?UC Treatments / Results  ?Labs ?(all labs ordered are listed, but only abnormal results are displayed) ?Labs Reviewed - No data to display ? ?EKG ? ? ?Radiology ?No results found. ? ?Procedures ?Procedures (including critical care time) ? ?Medications Ordered in UC ?Medications - No data to display ? ?Initial Impression / Assessment and Plan / UC Course  ?I have reviewed the triage vital signs and the nursing notes. ? ?Pertinent labs  results that were available during my care  from last week of the patient were reviewed by me and considered in my medical decision making (see chart for details). ? ? ?I refilled his Lisinopril as noted.  ?Needs to Fu with new PCP.  ?Final Clinical Impressions(s) / UC Diagnoses  ? ?Final diagnoses:  ?Hypertension, unspecified type  ? ?Discharge Instructions   ?None ?  ? ?ED Prescriptions   ? ? Medication Sig Dispense Auth. Provider  ? lisinopril (ZESTRIL) 20 MG tablet Take 1 tablet (20 mg total) by mouth daily. 30 tablet Rodriguez-Southworth, Sunday Spillers, PA-C  ? ?  ? ?PDMP not reviewed this encounter. ?  Shelby Mattocks, PA-C ?02/03/22 [id number] ? ?

## 2022-03-02 DIAGNOSIS — N1330 Unspecified hydronephrosis: Secondary | ICD-10-CM

## 2022-03-02 HISTORY — DX: Unspecified hydronephrosis: N13.30

## 2022-03-03 ENCOUNTER — Ambulatory Visit
Admission: RE | Admit: 2022-03-03 | Discharge: 2022-03-03 | Disposition: A | Discharge status: Home / Self Care | Payer: 59 | Source: Ambulatory Visit | Attending: Urology | Admitting: Urology

## 2022-03-03 DIAGNOSIS — N281 Cyst of kidney, acquired: Secondary | ICD-10-CM | POA: Diagnosis not present

## 2022-03-07 ENCOUNTER — Ambulatory Visit
Admission: RE | Admit: 2022-03-07 | Discharge: 2022-03-07 | Disposition: A | Discharge status: Home / Self Care | Payer: 59 | Source: Ambulatory Visit | Attending: Physician Assistant | Admitting: Physician Assistant

## 2022-03-07 VITALS — BP 139/95 | HR 63 | Temp 98.2°F | Resp 18 | Ht 71.0 in | Wt 298.0 lb

## 2022-03-07 DIAGNOSIS — I10 Essential (primary) hypertension: Secondary | ICD-10-CM

## 2022-03-07 DIAGNOSIS — Z760 Encounter for issue of repeat prescription: Secondary | ICD-10-CM | POA: Diagnosis not present

## 2022-03-07 MED ORDER — LISINOPRIL 20 MG PO TABS: 20.0000 mg | ORAL_TABLET | Freq: Every day | ORAL | 3 refills | Status: DC

## 2022-03-07 NOTE — ED Triage Notes (Signed)
Patient requesting a refill on his Lisinopril '20mg'$ ,  Pt just moved to the area and has an appointment with his new PCP on 06/16/2022 @ 3:00pm w/Dr Army Melia.  No issues.

## 2022-03-07 NOTE — ED Provider Notes (Signed)
[facility]    CSN: [id number] Arrival date & time: 03/07/22  1415      History   Chief Complaint Chief Complaint  Patient presents with   Hypertension    Need blood pressure medicine refill, have a new patient appt w/ Dr Army Melia , but could not get scheduled in until September 15th. - Entered by patient    HPI Colton Butler is a 61 y.o. male presenting for refill of lisinopril 20 mg tablets.  Patient has history of hypertension and is new to the area.  He is presenting with his wife today.  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  He does have an upcoming appointment in 3 months but does not have enough of his blood pressure medicine to get him through until then.  Patient was seen here last month and was given refill of lisinopril and is helping to get more to get him through to his appointment.  Other medical history significant for GERD and history of bariatric surgery as well as history of migraines.  Patient has been feeling well recently.  No chest pain, vision problems, dizziness, weakness, leg swelling, shortness of breath or palpitations.  No history of MI or stroke.  No other complaints.  HPI  Past Medical History:  Diagnosis Date   GERD (gastroesophageal reflux disease)    Hypertension     Patient Active Problem List   Diagnosis Date Noted   Microcytic anemia 05/28/2019   Essential hypertension 05/28/2019   History of Barrett's esophagus 05/28/2019   History of bariatric surgery 05/28/2019    Past Surgical History:  Procedure Laterality Date   GASTRIC BYPASS  2005       Home Medications    Prior to Admission medications   Medication Sig Start Date End Date Taking? Authorizing Provider  CALCIUM CITRATE PO calcium citrate   Yes [provider]  cetirizine (ZYRTEC) 10 MG tablet Take 10 mg by mouth daily.   Yes [provider]  Cyanocobalamin (B-12) 500 MCG SUBL Vitamin B12 500 mcg tablet  Take by oral route.   Yes  [provider]  Multiple Vitamins-Minerals (CENTRUM SILVER PO) Centrum Silver   Yes [provider]  omeprazole (PRILOSEC) 20 MG capsule omeprazole 20 mg capsule,delayed release  Take 1 capsule every day by oral route.   Yes [provider]  lisinopril (ZESTRIL) 20 MG tablet Take 1 tablet (20 mg total) by mouth daily. 03/07/22 04/06/22  Danton Clap, PA-C    Family History History reviewed. No pertinent family history.  Social History Social History   Tobacco Use   Smoking status: Never    Passive exposure: Never   Smokeless tobacco: Never  Vaping Use   Vaping Use: Never used  Substance Use Topics   Alcohol use: Never   Drug use: Never     Allergies   Patient has no known allergies.   Review of Systems Review of Systems  Constitutional:  Negative for fatigue.  Respiratory:  Negative for chest tightness and shortness of breath.   Cardiovascular:  Negative for chest pain, palpitations and leg swelling.  Gastrointestinal:  Negative for abdominal pain, nausea and vomiting.  Neurological:  Positive for headaches (history of migraines. None currently).    Physical Exam Triage Vital Signs ED Triage Vitals  Enc Vitals Group     BP 03/07/22 1533 (!) 139/95     Pulse Rate 03/07/22 1533 63     Resp 03/07/22 1533 18  Temp 03/07/22 1533 98.2 F (36.8 C)     Temp Source 03/07/22 1533 Oral     SpO2 03/07/22 1533 97 %     Weight 03/07/22 1535 298 lb (135.2 kg)     Height 03/07/22 1535 '5\' 11"'$  (1.803 m)     Head Circumference --      Peak Flow --      Pain Score 03/07/22 1535 0     Pain Loc --      Pain Edu? --      Excl. in [place]? --    No data found.  Updated Vital Signs BP (!) 139/95 (BP Location: Left Arm)   Pulse 63   Temp 98.2 F (36.8 C) (Oral)   Resp 18   Ht '5\' 11"'$  (1.803 m)   Wt 298 lb (135.2 kg)   SpO2 97%   BMI 41.56 kg/m   Physical Exam Vitals and nursing note reviewed.  Constitutional:      General: He is not in acute  distress.    Appearance: Normal appearance. He is well-developed. He is not ill-appearing.  HENT:     Head: Normocephalic and atraumatic.  Eyes:     General: No scleral icterus.    Conjunctiva/sclera: Conjunctivae normal.  Cardiovascular:     Rate and Rhythm: Normal rate and regular rhythm.  Pulmonary:     Effort: Pulmonary effort is normal. No respiratory distress.     Breath sounds: Normal breath sounds.  Musculoskeletal:     Cervical back: Neck supple.  Skin:    General: Skin is warm and dry.     Capillary Refill: Capillary refill takes less than 2 seconds.  Neurological:     General: No focal deficit present.     Mental Status: He is alert. Mental status is at baseline.     Motor: No weakness.     Gait: Gait normal.  Psychiatric:        Mood and Affect: Mood normal.        Behavior: Behavior normal.     UC Treatments / Results  Labs (all labs ordered are listed, but only abnormal results are displayed) Labs Reviewed - No data to display  EKG   Radiology No results found.  Procedures Procedures (including critical care time)  Medications Ordered in UC Medications - No data to display  Initial Impression / Assessment and Plan / UC Course  I have reviewed the triage vital signs and the nursing notes.  Pertinent labs & imaging results that were available during my care of the patient were reviewed by me and considered in my medical decision making (see chart for details).  61 year old male presenting for refill of lisinopril 20 mg tablets.  Patient has appointment with Dr. Army Melia on 06/16/2022.  Has been stable on lisinopril for the past several years.  No complaints today and he is feeling well.  Today BP is 139/95.  He says he only has a few days left of the lisinopril.  He is denying any red flag signs or symptoms related to hypertension.  Other vitals are stable.  Chest is clear to auscultation heart regular rate and rhythm.  Reviewed labs from 01/29/2022 when  patient went to the ER for kidney stone.  CBC and CMP as well as urinalysis reviewed.  No significant findings.  Sent lisinopril to pharmacy and placed 3 refills.  Advised him to keep his appoint with his PCP.  ED precautions reviewed.  Patient declines AVS.   Final  Clinical Impressions(s) / UC Diagnoses   Final diagnoses:  Essential hypertension  Medication refill   Discharge Instructions   None    ED Prescriptions     Medication Sig Dispense Auth. Provider   lisinopril (ZESTRIL) 20 MG tablet Take 1 tablet (20 mg total) by mouth daily. 30 tablet Gretta Cool      PDMP not reviewed this encounter.   Danton Clap, PA-C 03/07/22 [phone]

## 2022-03-07 NOTE — Discharge Instructions (Signed)
-  I have sent in the blood pressure medicine.  I placed a couple refills on them to get you through until your doctor's appointment.  Make sure to keep your appointment with your new PCP. - Continue to check your blood pressure periodically and follow a healthy lifestyle. - Go to ER if you have any severely elevated blood pressures with any associated vision changes, severe headaches, vomiting, dizziness, weakness, chest pain, shortness of breath or palpitations or increased leg swelling.

## 2022-03-15 ENCOUNTER — Ambulatory Visit (INDEPENDENT_AMBULATORY_CARE_PROVIDER_SITE_OTHER): Payer: 59 | Admitting: Urology

## 2022-03-15 ENCOUNTER — Other Ambulatory Visit: Payer: Self-pay | Admitting: Urology

## 2022-03-15 ENCOUNTER — Encounter: Payer: Self-pay | Admitting: Urology

## 2022-03-15 ENCOUNTER — Other Ambulatory Visit: Payer: Self-pay | Admitting: *Deleted

## 2022-03-15 ENCOUNTER — Other Ambulatory Visit
Admission: RE | Admit: 2022-03-15 | Discharge: 2022-03-15 | Disposition: A | Discharge status: Home / Self Care | Payer: 59 | Attending: Urology | Admitting: Urology

## 2022-03-15 ENCOUNTER — Telehealth: Payer: Self-pay

## 2022-03-15 VITALS — BP 148/84 | HR 90 | Ht 71.0 in | Wt 296.0 lb

## 2022-03-15 DIAGNOSIS — N200 Calculus of kidney: Secondary | ICD-10-CM | POA: Diagnosis present

## 2022-03-15 DIAGNOSIS — N2889 Other specified disorders of kidney and ureter: Secondary | ICD-10-CM | POA: Diagnosis not present

## 2022-03-15 DIAGNOSIS — N1330 Unspecified hydronephrosis: Secondary | ICD-10-CM

## 2022-03-15 DIAGNOSIS — N1339 Other hydronephrosis: Secondary | ICD-10-CM | POA: Diagnosis not present

## 2022-03-15 LAB — URINALYSIS, COMPLETE (UACMP) WITH MICROSCOPIC
Bilirubin Urine: NEGATIVE
Glucose, UA: NEGATIVE mg/dL
Hgb urine dipstick: NEGATIVE
Ketones, ur: NEGATIVE mg/dL
Leukocytes,Ua: NEGATIVE
Nitrite: NEGATIVE
Protein, ur: NEGATIVE mg/dL
Specific Gravity, Urine: <1.005 — ABNORMAL LOW (ref 1.005–1.030)
pH: 6 (ref 5.0–8.0)

## 2022-03-15 NOTE — Telephone Encounter (Signed)
I spoke with Colton Butler. We have discussed possible surgery dates and Friday June 23rd, 2023 was agreed upon by all parties. Patient given information about surgery date, what to expect pre-operatively and post operatively.  We discussed that a Pre-Admission Testing office will be calling to set up the pre-op visit that will take place prior to surgery, and that these appointments are typically done over the phone with a Pre-Admissions RN.  Informed patient that our office will communicate any additional care to be provided after surgery. Patients questions or concerns were discussed during our call. Advised to call our office should there be any additional information, questions or concerns that arise. Patient verbalized understanding.

## 2022-03-15 NOTE — Patient Instructions (Signed)
Ureteroscopy Ureteroscopy is a procedure to check for and treat problems inside part of the urinary tract. In this procedure, a thin, flexible tube with a light at the end (ureteroscope) is used to look at the inside of the kidneys and the ureters. The ureters are the tubes that carry urine from the kidneys to the bladder. The ureteroscope is inserted into one or both of the ureters. You may need this procedure if you have frequent urinary tract infections (UTIs), blood in your urine, or a stone in one of your ureters. A ureteroscopy can be done: To find the cause of urine blockage in a ureter and to evaluate other abnormalities inside the ureters or kidneys. To remove stones. To remove or treat growths of tissue (polyps), abnormal tissue, and some types of tumors. To remove a tissue sample and check it for disease under a microscope (biopsy). Tell a health care provider about: Any allergies you have. All medicines you are taking, including vitamins, herbs, eye drops, creams, and over-the-counter medicines. Any problems you or family members have had with anesthetic medicines. Any blood disorders you have. Any surgeries you have had. Any medical conditions you have. Whether you are pregnant or may be pregnant. What are the risks? Generally, this is a safe procedure. However, problems may occur, including: Bleeding. Infection. Allergic reactions to medicines. Scarring that narrows the ureter (stricture). Creating a hole in the ureter (perforation). What happens before the procedure? Staying hydrated Follow instructions from your health care provider about hydration, which may include: Up to 2 hours before the procedure - you may continue to drink clear liquids, such as water, clear fruit juice, black coffee, and plain tea.  Eating and drinking restrictions Follow instructions from your health care provider about eating and drinking, which may include: 8 hours before the procedure - stop  eating heavy meals or foods, such as meat, fried foods, or fatty foods. 6 hours before the procedure - stop eating light meals or foods, such as toast or cereal. 6 hours before the procedure - stop drinking milk or drinks that contain milk. 2 hours before the procedure - stop drinking clear liquids. Medicines Ask your health care provider about: Changing or stopping your regular medicines. This is especially important if you are taking diabetes medicines or blood thinners. Taking medicines such as aspirin and ibuprofen. These medicines can thin your blood. Do not take these medicines unless your health care provider tells you to take them. Taking over-the-counter medicines, vitamins, herbs, and supplements. General instructions Do not use any products that contain nicotine or tobacco for at least 4 weeks before the procedure. These products include cigarettes, e-cigarettes, and chewing tobacco. If you need help quitting, ask your health care provider. You may have a urine sample taken to check for infection. Plan to have someone take you home from the hospital or clinic. If you will be going home right after the procedure, plan to have someone with you for 24 hours. Ask your health care provider what steps will be taken to help prevent infection. These may include: Washing skin with a germ-killing soap. Receiving antibiotic medicine. What happens during the procedure?  An IV will be inserted into one of your veins. You will be given one or more of the following: A medicine to help you relax (sedative). A medicine to make you fall asleep (general anesthetic). A medicine that is injected into your spine to numb the area below and slightly above the injection site (spinal anesthetic). The  part of your body that drains urine from your bladder (urethra) will be cleaned with a germ-killing solution. The ureteroscope will be passed through your urethra into your bladder. A salt-water solution will  be sent through the ureteroscope to fill your bladder. This will help the health care provider see the openings of your ureters more clearly. The ureteroscope will be passed into your ureter. If a growth is found, a biopsy may be done. If a stone is found, it may be removed through the ureteroscope, or the stone may be broken up using a laser, shock waves, or electrical energy. In some cases, if the ureter is too small, a tube may be inserted that keeps the ureter open (ureteral stent). The stent may be left in place for 1 or 2 weeks to keep the ureter open, and then the ureteroscopy procedure will be done. The scope will be removed, and your bladder will be emptied. The procedure may vary among health care providers and hospitals. What can I expect after the procedure? After your procedure, it is common to have: Your blood pressure, heart rate, breathing rate, and blood oxygen level monitored until you leave the hospital or clinic. A burning sensation when you urinate. You may be asked to urinate. Blood in your urine. Mild discomfort in your bladder area or kidney area when urinating. A need to urinate more often or urgently. Follow these instructions at home: Medicines Take over-the-counter and prescription medicines only as told by your health care provider. If you were prescribed an antibiotic medicine, take it as told by your health care provider. Do not stop taking the antibiotic even if you start to feel better. General instructions  If you were given a sedative during the procedure, it can affect you for several hours. Do not drive or operate machinery until your health care provider says that it is safe. To relieve burning, take a warm bath or hold a warm washcloth over your groin. Drink enough fluid to keep your urine pale yellow. Drink two 8-ounce (237 mL) glasses of water every hour for the first 2 hours after you get home. Continue to drink water often at home. You can eat what  you normally do. Keep all follow-up visits as told by your health care provider. This is important. If you had a ureteral stent placed, ask your health care provider when you need to return to have it removed. Contact a health care provider if you have: Chills or a fever. Burning pain for longer than 24 hours after the procedure. Blood in your urine for longer than 24 hours after the procedure. Get help right away if you have: Large amounts of blood in your urine. Blood clots in your urine. Severe pain. Chest pain or trouble breathing. The feeling of a full bladder and you are unable to urinate. These symptoms may represent a serious problem that is an emergency. Do not wait to see if the symptoms will go away. Get medical help right away. Call your local emergency services (911 in the [place] Summary Ureteroscopy is a procedure to check for and treat problems inside part of the urinary tract. In this procedure, a thin, flexible tube with a light at the end (ureteroscope) is used to look at the inside of the kidneys and the ureters. You may need this procedure if you have frequent urinary tract infections (UTIs), blood in your urine, or a stone in a ureter. This information is not intended to replace advice given to  you by your health care provider. Make sure you discuss any questions you have with your health care provider. Document Revised: 08/18/2021 Document Reviewed: 06/25/2019 Elsevier Patient Education  [place].

## 2022-03-15 NOTE — Progress Notes (Signed)
   03/15/2022 12:41 PM   Colton Butler Jun 06, 1961 [id number]  Reason for visit: Follow up left hydronephrosis, microscopic hematuria, left renal lesion  HPI: 61 year old male with morbid obesity and BMI of 41 who was originally seen in the ER on 01/29/2022 with acute onset of urinary symptoms, sensation of incomplete emptying, and left-sided flank pain.  CT showed mild left hydronephrosis down to the bladder, but no evidence of stones.  There was also a small 1.3 cm left lower pole exophytic lesion incompletely evaluated.  His symptoms resolved a few hours after he was in the ER, and it was felt he may have passed a ureteral stone.  I saw him on 02/01/2022 and he was doing well with no complaints or flank pain, and had some microscopic hematuria with 6-10 RBCs on urinalysis.  At that point I recommended a follow-up renal ultrasound to confirm resolution of hydronephrosis, as well as a repeat UA.  He denies any problems since our last visit and continues to do well.  Urinalysis today is benign with no microscopic hematuria.  I personally viewed and interpreted the renal ultrasound dated 03/03/2022 showing persistent mild left hydronephrosis, as well as similar appearance of an indeterminate 1.3 cm left lower pole lesion that may represent a complicated cyst.  We reviewed possible etiologies of his left-sided hydronephrosis including incidental finding, chronic, stricture disease, or urothelial lesion/mass, less likely stone.  We discussed options including repeat ultrasound in 1 to 2 months, CT urogram, or more definitive evaluation with cystoscopy, left retrograde pyelogram, left diagnostic ureteroscopy.  Risk and benefits discussed at length.    -Using shared decision making he opted for cystoscopy with retrograde pyelogram and left diagnostic ureteroscopy, possible biopsy of any suspicious lesions. -We will also need follow-up in 6 to 12 months for repeat renal ultrasound or cross-sectional imaging of  left indeterminant 1.3 cm renal lesion  Billey Co, MD  [facility] [address], [place], [place] [address] [phone]

## 2022-03-15 NOTE — Progress Notes (Signed)
Surgical Physician Order Form [facility] [place]  * Scheduling expectation : Next Available  *Length of Case: 1 hour  *Clearance needed: no  *Anticoagulation Instructions: Hold all anticoagulants  *Aspirin Instructions: Hold Aspirin  *Post-op visit Date/Instructions: TBD  *Diagnosis: Left Hydronephrosis  *Procedure: left diagnostic ureteroscopy   Additional orders: N/A  -Admit type: OUTpatient  -Anesthesia: General  -VTE Prophylaxis Standing Order SCD's       Other:   -Standing Lab Orders Per Anesthesia    Lab other: UA&Urine Culture  -Standing Test orders EKG/Chest x-ray per Anesthesia       Test other:   - Medications:  Ancef 2gm IV  -Other orders:  N/A

## 2022-03-15 NOTE — Progress Notes (Signed)
[facility] Posting Form   Surgery Date/Time: Date: 03/24/2022  Surgeon: Dr. Nickolas Madrid, MD  Surgery Location: Day Surgery  Inpt ( No  )   Outpt (Yes)   Obs ( No  )   Diagnosis: N13.30 Left Hydronephrosis  -CPT: 84132  Surgery: Left Diagnostic Ureteroscopy  Stop Anticoagulations: Yes and hold ASA  Cardiac/Medical/Pulmonary Clearance needed: no  *Orders entered into EPIC  Date: 03/15/22   *Case booked in EPIC  Date: 03/15/22  *Notified pt of Surgery: Date: 03/15/22  PRE-OP UA & CX: yes, obtained in clinic on 03/15/2022  *Placed into Prior Authorization Work Fabio Bering Date: 03/15/22   Assistant/laser/rep:No

## 2022-03-17 LAB — URINE CULTURE: Culture: NO GROWTH

## 2022-03-21 ENCOUNTER — Encounter
Admission: RE | Admit: 2022-03-21 | Discharge: 2022-03-21 | Disposition: A | Discharge status: Home / Self Care | Payer: 59 | Source: Ambulatory Visit | Attending: Urology | Admitting: Urology

## 2022-03-21 VITALS — Ht 71.0 in | Wt 290.0 lb

## 2022-03-21 DIAGNOSIS — Z01810 Encounter for preprocedural cardiovascular examination: Secondary | ICD-10-CM | POA: Diagnosis not present

## 2022-03-21 DIAGNOSIS — Z01812 Encounter for preprocedural laboratory examination: Secondary | ICD-10-CM

## 2022-03-21 DIAGNOSIS — I10 Essential (primary) hypertension: Secondary | ICD-10-CM | POA: Diagnosis not present

## 2022-03-21 HISTORY — DX: Morbid (severe) obesity due to excess calories: E66.01

## 2022-03-21 HISTORY — DX: Iron deficiency anemia, unspecified: D50.9

## 2022-03-21 HISTORY — DX: Barrett's esophagus without dysplasia: K22.70

## 2022-03-21 NOTE — Patient Instructions (Addendum)
Your procedure is scheduled on: Friday, June 23 Report to the Registration Desk on the 1st floor of the [facility]. To find out your arrival time, please call [phone] between 1PM - 3PM on: Thursday, June 22 If your arrival time is 6:00 am, do not arrive prior to that time as the [facility] entrance doors do not open until 6:00 am.  REMEMBER: Instructions that are not followed completely may result in serious medical risk, up to and including death; or upon the discretion of your surgeon and anesthesiologist your surgery may need to be rescheduled.  Do not eat or drink after midnight the night before surgery.  No gum chewing, lozengers or hard candies.  TAKE THESE MEDICATIONS THE MORNING OF SURGERY WITH A SIP OF WATER:  Omeprazole - (take one the night before and one on the morning of surgery - helps to prevent nausea after surgery.)  One week prior to surgery: starting today, June 20 Stop Anti-inflammatories (NSAIDS) such as Advil, Aleve, Ibuprofen, Motrin, Naproxen, Naprosyn and Aspirin based products such as Excedrin, Goodys Powder, BC Powder. Stop ANY OVER THE COUNTER supplements until after surgery. Stop multiple vitamins You may however, continue to take Tylenol if needed for pain up until the day of surgery.  No Alcohol for 24 hours before or after surgery.  No Smoking including e-cigarettes for 24 hours prior to surgery.  No chewable tobacco products for at least 6 hours prior to surgery.  No nicotine patches on the day of surgery.  Do not use any "recreational" drugs for at least a week prior to your surgery.  Please be advised that the combination of cocaine and anesthesia may have negative outcomes, up to and including death. If you test positive for cocaine, your surgery will be cancelled.  On the morning of surgery brush your teeth with toothpaste and water, you may rinse your mouth with mouthwash if you wish. Do not swallow any toothpaste or mouthwash.  Do not  wear jewelry, make-up, hairpins, clips or nail polish.  Do not wear lotions, powders, or perfumes.   Do not shave body from the neck down 48 hours prior to surgery just in case you cut yourself which could leave a site for infection.   Contact lenses, hearing aids and dentures may not be worn into surgery.  Do not bring valuables to the hospital. [facility] is not responsible for any missing/lost belongings or valuables.   Notify your doctor if there is any change in your medical condition (cold, fever, infection).  Wear comfortable clothing (specific to your surgery type) to the hospital.  After surgery, you can help prevent lung complications by doing breathing exercises.  Take deep breaths and cough every 1-2 hours. Your doctor may order a device called an Incentive Spirometer to help you take deep breaths.  If you are being discharged the day of surgery, you will not be allowed to drive home. You will need a responsible adult (18 years or older) to drive you home and stay with you that night.   If you are taking public transportation, you will need to have a responsible adult (18 years or older) with you. Please confirm with your physician that it is acceptable to use public transportation.   Please call the [facility] at [phone] if you have any questions about these instructions.  Surgery Visitation Policy:  Patients undergoing a surgery or procedure may have two family members or support persons with them as long as  the person is not COVID-19 positive or experiencing its symptoms.

## 2022-03-23 MED ORDER — LACTATED RINGERS IV SOLN: INTRAVENOUS | Status: DC

## 2022-03-23 MED ORDER — CHLORHEXIDINE GLUCONATE 0.12 % MT SOLN
15.0000 mL | Freq: Once | OROMUCOSAL | Status: AC
  Administered 2022-03-24: 15 mL via OROMUCOSAL

## 2022-03-23 MED ORDER — CEFAZOLIN SODIUM-DEXTROSE 2-4 GM/100ML-% IV SOLN
2.0000 g | INTRAVENOUS | Status: AC
  Administered 2022-03-24: 2 g via INTRAVENOUS

## 2022-03-23 MED ORDER — ORAL CARE MOUTH RINSE: 15.0000 mL | Freq: Once | OROMUCOSAL | Status: AC

## 2022-03-24 ENCOUNTER — Encounter
Admission: RE | Disposition: A | Discharge status: Home / Self Care | Payer: Self-pay | Source: Ambulatory Visit | Attending: Urology

## 2022-03-24 ENCOUNTER — Ambulatory Visit: Payer: 59

## 2022-03-24 ENCOUNTER — Ambulatory Visit: Payer: 59 | Admitting: Anesthesiology

## 2022-03-24 ENCOUNTER — Encounter: Payer: Self-pay | Admitting: Urology

## 2022-03-24 ENCOUNTER — Ambulatory Visit
Admission: RE | Admit: 2022-03-24 | Discharge: 2022-03-24 | Disposition: A | Discharge status: Home / Self Care | Payer: 59 | Source: Ambulatory Visit | Attending: Urology | Admitting: Urology

## 2022-03-24 ENCOUNTER — Other Ambulatory Visit: Payer: Self-pay

## 2022-03-24 DIAGNOSIS — K219 Gastro-esophageal reflux disease without esophagitis: Secondary | ICD-10-CM | POA: Diagnosis not present

## 2022-03-24 DIAGNOSIS — Z6839 Body mass index (BMI) 39.0-39.9, adult: Secondary | ICD-10-CM | POA: Insufficient documentation

## 2022-03-24 DIAGNOSIS — I10 Essential (primary) hypertension: Secondary | ICD-10-CM | POA: Insufficient documentation

## 2022-03-24 DIAGNOSIS — K2270 Barrett's esophagus without dysplasia: Secondary | ICD-10-CM | POA: Insufficient documentation

## 2022-03-24 DIAGNOSIS — N1330 Unspecified hydronephrosis: Secondary | ICD-10-CM | POA: Diagnosis present

## 2022-03-24 HISTORY — PX: CYSTOSCOPY/RETROGRADE/URETEROSCOPY: SHX5316

## 2022-03-24 HISTORY — PX: URETEROSCOPY: SHX842

## 2022-03-24 SURGERY — URETEROSCOPY
Anesthesia: General | Laterality: Left

## 2022-03-24 MED ORDER — DEXAMETHASONE SODIUM PHOSPHATE 10 MG/ML IJ SOLN
INTRAMUSCULAR | Status: AC
  Filled 2022-03-24: qty 1

## 2022-03-24 MED ORDER — CHLORHEXIDINE GLUCONATE 0.12 % MT SOLN
OROMUCOSAL | Status: AC
  Filled 2022-03-24: qty 15

## 2022-03-24 MED ORDER — SUGAMMADEX SODIUM 200 MG/2ML IV SOLN
INTRAVENOUS | Status: DC | PRN
  Administered 2022-03-24: 200 mg via INTRAVENOUS

## 2022-03-24 MED ORDER — LACTATED RINGERS IV SOLN: INTRAVENOUS | Status: DC | PRN

## 2022-03-24 MED ORDER — FENTANYL CITRATE (PF) 100 MCG/2ML IJ SOLN
INTRAMUSCULAR | Status: AC
  Filled 2022-03-24: qty 2

## 2022-03-24 MED ORDER — MIDAZOLAM HCL 2 MG/2ML IJ SOLN
INTRAMUSCULAR | Status: DC | PRN
  Administered 2022-03-24 (×2): 1 mg via INTRAVENOUS

## 2022-03-24 MED ORDER — PROPOFOL 10 MG/ML IV BOLUS
INTRAVENOUS | Status: AC
  Filled 2022-03-24: qty 20

## 2022-03-24 MED ORDER — CEFAZOLIN SODIUM-DEXTROSE 2-4 GM/100ML-% IV SOLN
INTRAVENOUS | Status: AC
  Filled 2022-03-24: qty 100

## 2022-03-24 MED ORDER — ONDANSETRON HCL 4 MG/2ML IJ SOLN
INTRAMUSCULAR | Status: AC
  Filled 2022-03-24: qty 2

## 2022-03-24 MED ORDER — ROCURONIUM BROMIDE 10 MG/ML (PF) SYRINGE
PREFILLED_SYRINGE | INTRAVENOUS | Status: AC
  Filled 2022-03-24: qty 10

## 2022-03-24 MED ORDER — LIDOCAINE HCL (CARDIAC) PF 100 MG/5ML IV SOSY
PREFILLED_SYRINGE | INTRAVENOUS | Status: DC | PRN
  Administered 2022-03-24: 100 mg via INTRAVENOUS

## 2022-03-24 MED ORDER — OXYCODONE HCL 5 MG PO TABS: 5.0000 mg | ORAL_TABLET | Freq: Four times a day (QID) | ORAL | 0 refills | Status: DC | PRN

## 2022-03-24 MED ORDER — ROCURONIUM BROMIDE 100 MG/10ML IV SOLN
INTRAVENOUS | Status: DC | PRN
  Administered 2022-03-24: 35 mg via INTRAVENOUS
  Administered 2022-03-24: 10 mg via INTRAVENOUS

## 2022-03-24 MED ORDER — ACETAMINOPHEN 10 MG/ML IV SOLN: 1000.0000 mg | Freq: Once | INTRAVENOUS | Status: DC | PRN

## 2022-03-24 MED ORDER — OXYBUTYNIN CHLORIDE ER 10 MG PO TB24: 10.0000 mg | ORAL_TABLET | Freq: Every day | ORAL | 0 refills | Status: AC | PRN

## 2022-03-24 MED ORDER — MIDAZOLAM HCL 2 MG/2ML IJ SOLN
INTRAMUSCULAR | Status: AC
  Filled 2022-03-24: qty 2

## 2022-03-24 MED ORDER — FENTANYL CITRATE (PF) 100 MCG/2ML IJ SOLN
INTRAMUSCULAR | Status: DC | PRN
  Administered 2022-03-24 (×2): 50 ug via INTRAVENOUS

## 2022-03-24 MED ORDER — SODIUM CHLORIDE 0.9 % IR SOLN
Status: DC | PRN
  Administered 2022-03-24: 800 mL

## 2022-03-24 MED ORDER — FENTANYL CITRATE (PF) 100 MCG/2ML IJ SOLN: 25.0000 ug | INTRAMUSCULAR | Status: DC | PRN

## 2022-03-24 MED ORDER — OXYCODONE HCL 5 MG/5ML PO SOLN: 5.0000 mg | Freq: Once | ORAL | Status: DC | PRN

## 2022-03-24 MED ORDER — OXYCODONE HCL 5 MG PO TABS: 5.0000 mg | ORAL_TABLET | Freq: Once | ORAL | Status: DC | PRN

## 2022-03-24 MED ORDER — PROPOFOL 10 MG/ML IV BOLUS
INTRAVENOUS | Status: DC | PRN
  Administered 2022-03-24: 150 mg via INTRAVENOUS
  Administered 2022-03-24: 50 mg via INTRAVENOUS

## 2022-03-24 MED ORDER — IOHEXOL 180 MG/ML  SOLN
INTRAMUSCULAR | Status: DC | PRN
  Administered 2022-03-24: 10 mL

## 2022-03-24 MED ORDER — PROMETHAZINE HCL 25 MG/ML IJ SOLN: 6.2500 mg | INTRAMUSCULAR | Status: DC | PRN

## 2022-03-24 MED ORDER — DROPERIDOL 2.5 MG/ML IJ SOLN: 0.6250 mg | Freq: Once | INTRAMUSCULAR | Status: DC | PRN

## 2022-03-24 MED ORDER — LIDOCAINE HCL (PF) 2 % IJ SOLN
INTRAMUSCULAR | Status: AC
  Filled 2022-03-24: qty 5

## 2022-03-24 MED ORDER — DEXAMETHASONE SODIUM PHOSPHATE 10 MG/ML IJ SOLN
INTRAMUSCULAR | Status: DC | PRN
  Administered 2022-03-24: 10 mg via INTRAVENOUS

## 2022-03-24 MED ORDER — ONDANSETRON HCL 4 MG/2ML IJ SOLN
INTRAMUSCULAR | Status: DC | PRN
  Administered 2022-03-24: 4 mg via INTRAVENOUS

## 2022-03-24 MED ORDER — NITROFURANTOIN MONOHYD MACRO 100 MG PO CAPS: 100.0000 mg | ORAL_CAPSULE | Freq: Every day | ORAL | 0 refills | Status: AC

## 2022-03-24 SURGICAL SUPPLY — 33 items
BAG DRAIN CYSTO-URO LG1000N (MISCELLANEOUS) ×2 IMPLANT
BAG PRESSURE INF REUSE 3000 (BAG) ×2 IMPLANT
BRUSH SCRUB EZ 1% IODOPHOR (MISCELLANEOUS) ×2 IMPLANT
BULB IRRIG PATHFIND (MISCELLANEOUS) IMPLANT
CATH URET FLEX-TIP 2 LUMEN 10F (CATHETERS) IMPLANT
CATH URETL OPEN 5X70 (CATHETERS) ×2 IMPLANT
CNTNR SPEC 2.5X3XGRAD LEK (MISCELLANEOUS)
CONRAY 43 FOR UROLOGY 50M (MISCELLANEOUS) ×1 IMPLANT
CONT SPEC 4OZ STER OR WHT (MISCELLANEOUS)
CONT SPEC 4OZ STRL OR WHT (MISCELLANEOUS)
CONTAINER SPEC 2.5X3XGRAD LEK (MISCELLANEOUS) ×1 IMPLANT
DILATOR UROMAX ULTRA (MISCELLANEOUS) ×1 IMPLANT
GAUZE 4X4 16PLY ~~LOC~~+RFID DBL (SPONGE) ×3 IMPLANT
GLOVE SURG UNDER POLY LF SZ7.5 (GLOVE) ×3 IMPLANT
GOWN STRL REUS W/ TWL LRG LVL3 (GOWN DISPOSABLE) ×1 IMPLANT
GOWN STRL REUS W/ TWL XL LVL3 (GOWN DISPOSABLE) ×1 IMPLANT
GOWN STRL REUS W/TWL LRG LVL3 (GOWN DISPOSABLE) ×2
GOWN STRL REUS W/TWL XL LVL3 (GOWN DISPOSABLE) ×4
GUIDEWIRE GREEN .038 145CM (MISCELLANEOUS) ×2 IMPLANT
GUIDEWIRE STR DUAL SENSOR (WIRE) ×2 IMPLANT
IV NS IRRIG 3000ML ARTHROMATIC (IV SOLUTION) ×2 IMPLANT
KIT TURNOVER CYSTO (KITS) ×2 IMPLANT
MANIFOLD NEPTUNE II (INSTRUMENTS) ×1 IMPLANT
PACK CYSTO AR (MISCELLANEOUS) ×2 IMPLANT
SET CYSTO W/LG BORE CLAMP LF (SET/KITS/TRAYS/PACK) ×2 IMPLANT
SHEATH NAVIGATOR HD 12/14X36 (SHEATH) IMPLANT
STENT URET 6FRX28 CONTOUR (STENTS) ×1 IMPLANT
SURGILUBE 2OZ TUBE FLIPTOP (MISCELLANEOUS) ×2 IMPLANT
TUBING ART PRESS 48 MALE/FEM (TUBING) IMPLANT
VALVE UROSEAL ADJ ENDO (VALVE) IMPLANT
WATER STERILE IRR 1000ML POUR (IV SOLUTION) ×2 IMPLANT
WATER STERILE IRR 500ML POUR (IV SOLUTION) ×2 IMPLANT
uro max ultra 15f x 6 cm ×1 IMPLANT

## 2022-03-24 NOTE — Anesthesia Preprocedure Evaluation (Signed)
Anesthesia Evaluation  Patient identified by MRN, date of birth, ID band Patient awake    Reviewed: Allergy & Precautions, NPO status , Patient's Chart, lab work & pertinent test results  Airway Mallampati: II  TM Distance: >3 FB Neck ROM: full    Dental  (+) Edentulous Upper, Partial Lower   Pulmonary neg pulmonary ROS,    Pulmonary exam normal        Cardiovascular hypertension, Pt. on medications Normal cardiovascular exam     Neuro/Psych negative neurological ROS  negative psych ROS   GI/Hepatic Neg liver ROS, GERD  Controlled and Medicated,  Endo/Other  negative endocrine ROS  Renal/GU Left Hydronephrosis     Musculoskeletal   Abdominal (+) + obese,   Peds  Hematology negative hematology ROS (+)   Anesthesia Other Findings Past Medical History: No date: Barrett's esophagus No date: GERD (gastroesophageal reflux disease) 03/2022: Hydronephrosis, left No date: Hypertension No date: Microcytic anemia No date: Morbid obesity (HCC)  Past Surgical History: 2020: COLONOSCOPY WITH ESOPHAGOGASTRODUODENOSCOPY (EGD) 2008: GASTRIC BLEED REPAIR 2002: KNEE ARTHROSCOPY; 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  BMI    Body Mass Index: 39.75 kg/m      Reproductive/Obstetrics negative OB ROS                             Anesthesia Physical Anesthesia Plan  ASA: 2  Anesthesia Plan: General ETT   Post-op Pain Management: Minimal or no pain anticipated   Induction: Intravenous  PONV Risk Score and Plan: 2 and Ondansetron, Dexamethasone, Midazolam and Treatment may vary due to age or medical condition  Airway Management Planned: Oral ETT  Additional Equipment:   Intra-op Plan:   Post-operative Plan: Extubation in OR  Informed Consent: I have reviewed the patients History and Physical, chart, labs and discussed the procedure including the risks, benefits and alternatives for the  proposed anesthesia with the patient or authorized representative who has indicated his/her understanding and acceptance.     Dental Advisory Given  Plan Discussed with: Anesthesiologist, CRNA and Surgeon  Anesthesia Plan Comments:         Anesthesia Quick Evaluation

## 2022-04-05 ENCOUNTER — Encounter: Payer: Self-pay | Admitting: Urology

## 2022-04-05 ENCOUNTER — Ambulatory Visit (INDEPENDENT_AMBULATORY_CARE_PROVIDER_SITE_OTHER): Payer: 59 | Admitting: Urology

## 2022-04-05 VITALS — BP 122/89 | HR 80 | Ht 71.0 in

## 2022-04-05 DIAGNOSIS — Z466 Encounter for fitting and adjustment of urinary device: Secondary | ICD-10-CM | POA: Diagnosis not present

## 2022-04-05 DIAGNOSIS — N1330 Unspecified hydronephrosis: Secondary | ICD-10-CM

## 2022-04-05 LAB — URINALYSIS, COMPLETE
Bilirubin, UA: NEGATIVE
Glucose, UA: NEGATIVE
Ketones, UA: NEGATIVE
Nitrite, UA: NEGATIVE
Specific Gravity, UA: 1.015 (ref 1.005–1.030)
Urobilinogen, Ur: 0.2 mg/dL (ref 0.2–1.0)
pH, UA: 5.5 (ref 5.0–7.5)

## 2022-04-05 LAB — MICROSCOPIC EXAMINATION

## 2022-04-05 MED ORDER — CEPHALEXIN 250 MG PO CAPS
500.0000 mg | ORAL_CAPSULE | Freq: Once | ORAL | Status: AC
  Administered 2022-04-05: 500 mg via ORAL

## 2022-04-05 NOTE — Progress Notes (Signed)
Cystoscopy Procedure Note:  Indication: Stent removal s/p left diagnostic ureteroscopy, balloon dilation of subtle narrowing of left distal ureter with mild left hydronephrosis  Keflex given for prophylaxis  After informed consent and discussion of the procedure and its risks, Colton Butler was positioned and prepped in the standard fashion. Cystoscopy was performed with a flexible cystoscope. The stent was grasped with flexible graspers and removed in its entirety. The patient tolerated the procedure well.  Findings: Uncomplicated stent removal  Assessment and Plan: -RTC 1-2 months with renal ultrasound prior to evaluate resolution of hydronephrosis -Will need repeat cross-sectional imaging in 6 to 12 months for 1 cm left renal lesion of unclear etiology   Billey Co, MD 04/05/2022

## 2022-05-01 ENCOUNTER — Ambulatory Visit
Admission: RE | Admit: 2022-05-01 | Discharge: 2022-05-01 | Disposition: A | Discharge status: Home / Self Care | Payer: 59 | Source: Ambulatory Visit | Attending: Urology | Admitting: Urology

## 2022-05-01 DIAGNOSIS — N1330 Unspecified hydronephrosis: Secondary | ICD-10-CM | POA: Insufficient documentation

## 2022-05-24 ENCOUNTER — Ambulatory Visit: Payer: 59 | Admitting: Urology

## 2022-05-30 ENCOUNTER — Encounter: Payer: Self-pay | Admitting: Urology

## 2022-05-30 ENCOUNTER — Ambulatory Visit (INDEPENDENT_AMBULATORY_CARE_PROVIDER_SITE_OTHER): Payer: 59 | Admitting: Urology

## 2022-05-30 ENCOUNTER — Encounter: Payer: Self-pay | Admitting: Internal Medicine

## 2022-05-30 VITALS — BP 146/91 | HR 78 | Ht 71.0 in | Wt 283.0 lb

## 2022-05-30 DIAGNOSIS — N2889 Other specified disorders of kidney and ureter: Secondary | ICD-10-CM

## 2022-05-30 DIAGNOSIS — C649 Malignant neoplasm of unspecified kidney, except renal pelvis: Secondary | ICD-10-CM | POA: Insufficient documentation

## 2022-05-30 NOTE — Progress Notes (Signed)
   05/30/2022 12:47 PM   Colton Butler [birth year] [id number]  Reason for visit: Follow up left hydronephrosis, small left renal mass  HPI: 61 year old male who originally presented to the ER in April 2023 with severe left-sided flank pain and CT showed moderate left-sided hydronephrosis with no evidence of stones.  A follow-up ultrasound showed persistent left-sided hydronephrosis and he ultimately underwent cystoscopy, left retrograde pyelogram, left balloon dilation of subtle narrowing of the left distal ureter and stent placement.  Stent was removed a few weeks later.  He has been overall doing well since that time.  He denies any severe left-sided flank pain or significant urinary symptoms.  He reports some occasional intermittent mild " burning" in his left side of unclear etiology.  I personally viewed and interpreted the renal ultrasound that shows no hydronephrosis, and stable 1.5 cm left exophytic lower pole lesion.  Reassurance provided regarding normal ultrasound findings, and do not suspect that his intermittent left-sided back pain would be related to the kidney with recent normal ultrasound showing no hydronephrosis.  We reviewed the small renal mass and need for further imaging with CT abdomen and pelvis with and without contrast, which will also evaluate for any hydronephrosis.  We opted for November 2023 to allow some interim time to see if this lesion is changing from original scan in April 2023.  RTC December 2023 with CT abdomen and pelvis prior   Billey Co, MD  [facility] [address], [place], [place] [address] [phone]

## 2022-06-14 ENCOUNTER — Other Ambulatory Visit: Payer: Self-pay | Admitting: Internal Medicine

## 2022-06-16 ENCOUNTER — Ambulatory Visit (INDEPENDENT_AMBULATORY_CARE_PROVIDER_SITE_OTHER): Payer: 59 | Admitting: Internal Medicine

## 2022-06-16 ENCOUNTER — Ambulatory Visit
Admission: RE | Admit: 2022-06-16 | Discharge: 2022-06-16 | Disposition: A | Discharge status: Home / Self Care | Payer: 59 | Attending: Internal Medicine | Admitting: Internal Medicine

## 2022-06-16 ENCOUNTER — Encounter: Payer: Self-pay | Admitting: Internal Medicine

## 2022-06-16 ENCOUNTER — Ambulatory Visit
Admission: RE | Admit: 2022-06-16 | Discharge: 2022-06-16 | Disposition: A | Discharge status: Home / Self Care | Payer: 59 | Source: Ambulatory Visit | Attending: Internal Medicine | Admitting: Internal Medicine

## 2022-06-16 VITALS — BP 134/88 | HR 73 | Ht 71.0 in | Wt 282.4 lb

## 2022-06-16 DIAGNOSIS — G8929 Other chronic pain: Secondary | ICD-10-CM

## 2022-06-16 DIAGNOSIS — I10 Essential (primary) hypertension: Secondary | ICD-10-CM

## 2022-06-16 DIAGNOSIS — M79601 Pain in right arm: Secondary | ICD-10-CM | POA: Diagnosis not present

## 2022-06-16 DIAGNOSIS — M25562 Pain in left knee: Secondary | ICD-10-CM | POA: Diagnosis not present

## 2022-06-16 MED ORDER — LISINOPRIL 20 MG PO TABS: 20.0000 mg | ORAL_TABLET | Freq: Every day | ORAL | 1 refills | Status: DC

## 2022-06-16 NOTE — Progress Notes (Signed)
Date:  06/16/2022   Name:  Colton Butler   DOB:  1960/10/09   MRN:  [id number]   Chief Complaint: New Patient (Initial Visit), Hypertension, Arm Pain (Right arm pain. Radiates down into arm, and right hand. Plays the guitar and it affects the ability to play. Started 6+ months.), and Leg Pain (Stiff, and tight from under knee, down to the calf, and the back of the ankle. )  Hypertension This is a chronic problem. The problem is uncontrolled. Pertinent negatives include no chest pain, headaches, palpitations or shortness of breath. Past treatments include ACE inhibitors. There is no history of kidney disease, CAD/MI or CVA.  Arm Pain  There was no injury mechanism (but he plays guitar all day and uses a mouse with his right). The pain is present in the right forearm and upper right arm. The quality of the pain is described as aching. Pertinent negatives include no chest pain.  Leg Pain  Incident onset: hurts off and on for several months. There was no injury mechanism. The pain is present in the left knee (radiating down calf). The quality of the pain is described as aching and cramping. The pain has been Fluctuating since onset. The symptoms are aggravated by weight bearing. He has tried nothing for the symptoms.    Lab Results  Component Value Date   NA 134 (L) 01/29/2022   K 4.0 01/29/2022   CO2 21 (L) 01/29/2022   GLUCOSE 106 (H) 01/29/2022   BUN 17 01/29/2022   CREATININE 1.22 01/29/2022   CALCIUM 9.7 01/29/2022   GFRNONAA >60 01/29/2022   No results found for: "CHOL", "HDL", "LDLCALC", "LDLDIRECT", "TRIG", "CHOLHDL" No results found for: "TSH" No results found for: "HGBA1C" Lab Results  Component Value Date   WBC 9.7 01/29/2022   HGB 15.5 01/29/2022   HCT 44.8 01/29/2022   MCV 92.8 01/29/2022   PLT 290 01/29/2022   Lab Results  Component Value Date   ALT 24 01/29/2022   AST 30 01/29/2022   ALKPHOS 79 01/29/2022   BILITOT 0.8 01/29/2022   No results found for:  "25OHVITD2", "25OHVITD3", "VD25OH"   Review of Systems  Constitutional:  Negative for fatigue and unexpected weight change.  HENT:  Negative for nosebleeds.   Eyes:  Negative for visual disturbance.  Respiratory:  Negative for cough, chest tightness, shortness of breath and wheezing.   Cardiovascular:  Negative for chest pain, palpitations and leg swelling.  Gastrointestinal:  Negative for abdominal pain, constipation and diarrhea.  Musculoskeletal:  Positive for arthralgias and myalgias.  Neurological:  Negative for dizziness, weakness, light-headedness and headaches.  Psychiatric/Behavioral:  Negative for dysphoric mood and sleep disturbance. The patient is not nervous/anxious.     Patient Active Problem List   Diagnosis Date Noted   Other specified disorders of kidney and ureter 05/30/2022   Left renal mass 05/30/2022   Microcytic anemia 05/28/2019   Essential hypertension 05/28/2019   History of Barrett's esophagus 05/28/2019   History of bariatric surgery 05/28/2019    Allergies  Allergen Reactions   Aspirin Other (See Comments)    Due to gastric bypass (GI bleed)   Nsaids Other (See Comments)    Due to gastric bypass (GI bleed)    Past Surgical History:  Procedure Laterality Date   COLONOSCOPY WITH ESOPHAGOGASTRODUODENOSCOPY (EGD)  2020   CYSTOSCOPY/RETROGRADE/URETEROSCOPY Left 03/24/2022   Procedure: CYSTOSCOPY/RETROGRADE/URETEROSCOPY;  Surgeon: Billey Co, MD;  Location: [facility];  Service: Urology;  Laterality: Left;   GASTRIC BLEED  REPAIR  2008   KNEE ARTHROSCOPY Left 2002   meniscus   ROUX-EN-Y GASTRIC BYPASS  07/2004   URETEROSCOPY Left 03/24/2022   Procedure: DIAGNOSTIC URETEROSCOPY;  Surgeon: Billey Co, MD;  Location: [facility];  Service: Urology;  Laterality: Left;    Social History   Tobacco Use   Smoking status: Never    Passive exposure: Never   Smokeless tobacco: Never  Vaping Use   Vaping Use: Never used  Substance Use Topics    Alcohol use: Never   Drug use: Never     Medication list has been reviewed and updated.  Current Meds  Medication Sig   CALCIUM CITRATE PO Take 600 mg by mouth daily.   cetirizine (ZYRTEC) 10 MG tablet Take 10 mg by mouth daily.   Iron, Ferrous Sulfate, 325 (65 Fe) MG TABS Take 325 mg by mouth 2 (two) times a week. Monday & Thursday   Multiple Vitamins-Minerals (CENTRUM SILVER PO) Take 1 tablet by mouth daily.   Omeprazole 20 MG TBEC omeprazole 20 mg capsule,delayed release  Take 1 capsule every day by oral route.   vitamin B-12 (CYANOCOBALAMIN) 1000 MCG tablet Take 1,000 mcg by mouth daily.   [DISCONTINUED] lisinopril (ZESTRIL) 20 MG tablet Take 1 tablet (20 mg total) by mouth daily.       06/16/2022    2:07 PM  GAD 7 : Generalized Anxiety Score  Nervous, Anxious, on Edge 0  Control/stop worrying 0  Worry too much - different things 0  Trouble relaxing 0  Restless 0  Easily annoyed or irritable 0  Afraid - awful might happen 0  Total GAD 7 Score 0  Anxiety Difficulty Not difficult at all       06/16/2022    2:07 PM  Depression screen PHQ 2/9  Decreased Interest 0  Down, Depressed, Hopeless 0  PHQ - 2 Score 0  Altered sleeping 2  Tired, decreased energy 0  Change in appetite 0  Feeling bad or failure about yourself  0  Trouble concentrating 0  Moving slowly or fidgety/restless 0  Suicidal thoughts 0  PHQ-9 Score 2  Difficult doing work/chores Very difficult    BP Readings from Last 3 Encounters:  06/16/22 134/88  05/30/22 (!) 146/91  04/05/22 122/89    Physical Exam Vitals and nursing note reviewed.  Constitutional:      General: He is not in acute distress.    Appearance: Normal appearance. He is well-developed.  HENT:     Head: Normocephalic and atraumatic.  Neck:     Vascular: No carotid bruit.  Cardiovascular:     Rate and Rhythm: Normal rate and regular rhythm.     Heart sounds: No murmur heard. Pulmonary:     Effort: Pulmonary effort is  normal. No respiratory distress.     Breath sounds: No wheezing or rhonchi.  Musculoskeletal:     Right shoulder: No tenderness or bony tenderness. Normal range of motion.     Right elbow: Normal.     Right forearm: Tenderness (along the dorsum, worse with hand movement) present.     Cervical back: Normal range of motion.     Left knee: Effusion (possible small effusion) present. No swelling, deformity, bony tenderness or crepitus. No tenderness. Normal alignment.       Legs:     Comments: Soft tissue fullness - minimally tender; not warm or red, mobile   Lymphadenopathy:     Cervical: No cervical adenopathy.  Skin:  General: Skin is warm and dry.     Findings: No rash.  Neurological:     Mental Status: He is alert and oriented to person, place, and time.     Sensory: Sensation is intact.     Motor: Motor function is intact.     Coordination: Coordination is intact.     Gait: Gait is intact.     Comments: He is using a cane for stability  Psychiatric:        Mood and Affect: Mood normal.        Behavior: Behavior normal.     Wt Readings from Last 3 Encounters:  06/16/22 282 lb 6.4 oz (128.1 kg)  05/30/22 283 lb (128.4 kg)  03/24/22 285 lb (129.3 kg)    BP 134/88 (BP Location: Left Arm, Cuff Size: Large)   Pulse 73   Ht '5\' 11"'$  (1.803 m)   Wt 282 lb 6.4 oz (128.1 kg)   SpO2 96%   BMI 39.39 kg/m   Assessment and Plan: 1. Essential hypertension BP fairly well controlled - he should continue same medications and monitor BP at home. Continue diet efforts and reduced sodium intake - lisinopril (ZESTRIL) 20 MG tablet; Take 1 tablet (20 mg total) by mouth daily.  Dispense: 90 tablet; Refill: 1  2. Chronic pain of left knee With posterior calf soft tissue fullness of uncertain etiology Suspect he has some OA of the knee. Will get imaging. Recommend Tylenol 650 -1000 mg tid. - DG Knee Complete 4 Views Left  3. Right arm pain Appears to be overuse from guitar playing  and mouse use Modify and reduce use temporarily to see if there is improvement Tylenol for knee pain should also help.   Partially dictated using Editor, commissioning. Any errors are unintentional.  Halina Maidens, MD [facility]  06/16/2022

## 2022-06-16 NOTE — Patient Instructions (Signed)
Tylenol maximum dose per day is 4000 mg  Take at least 650 mg three times a day.

## 2022-08-04 ENCOUNTER — Other Ambulatory Visit: Payer: Self-pay

## 2022-08-04 ENCOUNTER — Telehealth: Payer: Self-pay | Admitting: Internal Medicine

## 2022-08-04 MED ORDER — BLOOD PRESSURE MONITORING KIT: 1.0000 | PACK | Freq: Every day | 0 refills | Status: DC

## 2022-08-04 MED ORDER — BLOOD PRESSURE MONITORING KIT: 1.0000 | PACK | Freq: Every day | 0 refills | Status: AC

## 2022-08-04 NOTE — Telephone Encounter (Signed)
Pt wife stated pt BP cuff broke. Pt wife is requesting Rx as insurance will pay for half of it. Pt prefers the OMRON Blood Pressure Monitor series 5 or 7 cuff size, is 17 inches. Is unsure if they need large or if standard will work.  [id number] [id number] Phone: [phone]: [phone] Hours: Not open 24 hours   Please advise.

## 2022-08-04 NOTE — Telephone Encounter (Signed)
BP monitor sent.  KP

## 2022-08-14 ENCOUNTER — Ambulatory Visit: Payer: Self-pay

## 2022-08-14 ENCOUNTER — Ambulatory Visit (INDEPENDENT_AMBULATORY_CARE_PROVIDER_SITE_OTHER): Payer: 59 | Admitting: Family Medicine

## 2022-08-14 ENCOUNTER — Encounter: Payer: Self-pay | Admitting: Family Medicine

## 2022-08-14 VITALS — BP 128/98 | HR 84 | Ht 71.0 in | Wt 287.0 lb

## 2022-08-14 DIAGNOSIS — I10 Essential (primary) hypertension: Secondary | ICD-10-CM | POA: Diagnosis not present

## 2022-08-14 MED ORDER — HYDROCHLOROTHIAZIDE 12.5 MG PO TABS: 12.5000 mg | ORAL_TABLET | Freq: Every day | ORAL | 0 refills | Status: DC

## 2022-08-14 NOTE — Telephone Encounter (Signed)
  Chief Complaint: High BP readings - slight HA Symptoms: Above Frequency: unsure Pertinent Negatives: Patient denies Dizziness, weakness Disposition: '[]'$ ED /'[]'$ Urgent Care (no appt availability in office) / '[x]'$ Appointment(In office/virtual)/ '[]'$  [place] [facility]/ '[]'$ Home Care/ '[]'$ Refused Recommended Disposition /'[]'$ [place]/ '[]'$  Follow-up with PCP Additional Notes: PT called to report high BP readings. 9th 129/99, 128/96  10th 138/101 13th 138/100. PT does not take Bp readings with proper technique, we reviewed. PT does report a slight HA, ( he states that is not unusual for him). Pt also states he feels a little off.  Pt sounds a bit anxious about these bp readings.    Reason for Disposition  Systolic BP  >= 338 OR Diastolic >= 329  Answer Assessment - Initial Assessment Questions 1. BLOOD PRESSURE: "What is the blood pressure?" "Did you take at least two measurements 5 minutes apart?"     no 2. ONSET: "When did you take your blood pressure?"     Over the past few days. 3. HOW: "How did you take your blood pressure?" (e.g., automatic home BP monitor, visiting nurse)     Automatic 4. HISTORY: "Do you have a history of high blood pressure?"     yes 5. MEDICINES: "Are you taking any medicines for blood pressure?" "Have you missed any doses recently?"     yes 6. OTHER SYMPTOMS: "Do you have any symptoms?" (e.g., blurred vision, chest pain, difficulty breathing, headache, weakness)     no 7. PREGNANCY: "Is there any chance you are pregnant?" "When was your last menstrual period?"     na  Protocols used: Blood Pressure - High-A-AH

## 2022-08-14 NOTE — Patient Instructions (Addendum)
Managing Your Hypertension Hypertension, also called high blood pressure, is when the force of the blood pressing against the walls of the arteries is too strong. Arteries are blood vessels that carry blood from your heart throughout your body. Hypertension forces the heart to work harder to pump blood and may cause the arteries to become narrow or stiff. Understanding blood pressure readings A blood pressure reading includes a higher number over a lower number: The first, or top, number is called the systolic pressure. It is a measure of the pressure in your arteries as your heart beats. The second, or bottom number, is called the diastolic pressure. It is a measure of the pressure in your arteries as the heart relaxes. For most people, a normal blood pressure is below 120/80. Your personal target blood pressure may vary depending on your medical conditions, your age, and other factors. Blood pressure is classified into four stages. Based on your blood pressure reading, your health care provider may use the following stages to determine what type of treatment you need, if any. Systolic pressure and diastolic pressure are measured in a unit called millimeters of mercury (mmHg). Normal Systolic pressure: below 120. Diastolic pressure: below 80. Elevated Systolic pressure: 120-129. Diastolic pressure: below 80. Hypertension stage 1 Systolic pressure: 130-139. Diastolic pressure: 80-89. Hypertension stage 2 Systolic pressure: 140 or above. Diastolic pressure: 90 or above. How can this condition affect me? Managing your hypertension is very important. Over time, hypertension can damage the arteries and decrease blood flow to parts of the body, including the brain, heart, and kidneys. Having untreated or uncontrolled hypertension can lead to: A heart attack. A stroke. A weakened blood vessel (aneurysm). Heart failure. Kidney damage. Eye damage. Memory and concentration problems. Vascular  dementia. What actions can I take to manage this condition? Hypertension can be managed by making lifestyle changes and possibly by taking medicines. Your health care provider will help you make a plan to bring your blood pressure within a normal range. You may be referred for counseling on a healthy diet and physical activity. Nutrition  Eat a diet that is high in fiber and potassium, and low in salt (sodium), added sugar, and fat. An example eating plan is called the DASH diet. DASH stands for Dietary Approaches to Stop Hypertension. To eat this way: Eat plenty of fresh fruits and vegetables. Try to fill one-half of your plate at each meal with fruits and vegetables. Eat whole grains, such as whole-wheat pasta, brown rice, or whole-grain bread. Fill about one-fourth of your plate with whole grains. Eat low-fat dairy products. Avoid fatty cuts of meat, processed or cured meats, and poultry with skin. Fill about one-fourth of your plate with lean proteins such as fish, chicken without skin, beans, eggs, and tofu. Avoid pre-made and processed foods. These tend to be higher in sodium, added sugar, and fat. Reduce your daily sodium intake. Many people with hypertension should eat less than 1,500 mg of sodium a day. Lifestyle  Work with your health care provider to maintain a healthy body weight or to lose weight. Ask what an ideal weight is for you. Get at least 30 minutes of exercise that causes your heart to beat faster (aerobic exercise) most days of the week. Activities may include walking, swimming, or biking. Include exercise to strengthen your muscles (resistance exercise), such as weight lifting, as part of your weekly exercise routine. Try to do these types of exercises for 30 minutes at least 3 days a week. Do   not use any products that contain nicotine or tobacco. These products include cigarettes, chewing tobacco, and vaping devices, such as e-cigarettes. If you need help quitting, ask your  health care provider. Control any long-term (chronic) conditions you have, such as high cholesterol or diabetes. Identify your sources of stress and find ways to manage stress. This may include meditation, deep breathing, or making time for fun activities. Alcohol use Do not drink alcohol if: Your health care provider tells you not to drink. You are pregnant, may be pregnant, or are planning to become pregnant. If you drink alcohol: Limit how much you have to: 0-1 drink a day for women. 0-2 drinks a day for men. Know how much alcohol is in your drink. In the [place] one drink equals one 12 oz bottle of beer (355 mL), one 5 oz glass of wine (148 mL), or one 1 oz glass of hard liquor (44 mL). Medicines Your health care provider may prescribe medicine if lifestyle changes are not enough to get your blood pressure under control and if: Your systolic blood pressure is 130 or higher. Your diastolic blood pressure is 80 or higher. Take medicines only as told by your health care provider. Follow the directions carefully. Blood pressure medicines must be taken as told by your health care provider. The medicine does not work as well when you skip doses. Skipping doses also puts you at risk for problems. Monitoring Before you monitor your blood pressure: Do not smoke, drink caffeinated beverages, or exercise within 30 minutes before taking a measurement. Use the bathroom and empty your bladder (urinate). Sit quietly for at least 5 minutes before taking measurements. Monitor your blood pressure at home as told by your health care provider. To do this: Sit with your back straight and supported. Place your feet flat on the floor. Do not cross your legs. Support your arm on a flat surface, such as a table. Make sure your upper arm is at heart level. Each time you measure, take two or three readings one minute apart and record the results. You may also need to have your blood pressure checked regularly by  your health care provider. General information Talk with your health care provider about your diet, exercise habits, and other lifestyle factors that may be contributing to hypertension. Review all the medicines you take with your health care provider because there may be side effects or interactions. Keep all follow-up visits. Your health care provider can help you create and adjust your plan for managing your high blood pressure. Where to find more information National Heart, Lung, and Blood Institute: www.nhlbi.nih.gov American Heart Association: www.heart.org Contact a health care provider if: You think you are having a reaction to medicines you have taken. You have repeated (recurrent) headaches. You feel dizzy. You have swelling in your ankles. You have trouble with your vision. Get help right away if: You develop a severe headache or confusion. You have unusual weakness or numbness, or you feel faint. You have severe pain in your chest or abdomen. You vomit repeatedly. You have trouble breathing. These symptoms may be an emergency. Get help right away. Call 911. Do not wait to see if the symptoms will go away. Do not drive yourself to the hospital. Summary Hypertension is when the force of blood pumping through your arteries is too strong. If this condition is not controlled, it may put you at risk for serious complications. Your personal target blood pressure may vary depending on your medical conditions,   your age, and other factors. For most people, a normal blood pressure is less than 120/80. Hypertension is managed by lifestyle changes, medicines, or both. Lifestyle changes to help manage hypertension include losing weight, eating a healthy, low-sodium diet, exercising more, stopping smoking, and limiting alcohol. This information is not intended to replace advice given to you by your health care provider. Make sure you discuss any questions you have with your health care  provider. Document Revised: 06/02/2021 Document Reviewed: 06/02/2021 Elsevier Patient Education  [place]. Low-Sodium Eating Plan Sodium, which is an element that makes up salt, helps you maintain a healthy balance of fluids in your body. Too much sodium can increase your blood pressure and cause fluid and waste to be held in your body. Your health care provider or dietitian may recommend following this plan if you have high blood pressure (hypertension), kidney disease, liver disease, or heart failure. Eating less sodium can help lower your blood pressure, reduce swelling, and protect your heart, liver, and kidneys. What are tips for following this plan? Reading food labels The Nutrition Facts label lists the amount of sodium in one serving of the food. If you eat more than one serving, you must multiply the listed amount of sodium by the number of servings. Choose foods with less than 140 mg of sodium per serving. Avoid foods with 300 mg of sodium or more per serving. Shopping  Look for lower-sodium products, often labeled as "low-sodium" or "no salt added." Always check the sodium content, even if foods are labeled as "unsalted" or "no salt added." Buy fresh foods. Avoid canned foods and pre-made or frozen meals. Avoid canned, cured, or processed meats. Buy breads that have less than 80 mg of sodium per slice. Cooking  Eat more home-cooked food and less restaurant, buffet, and fast food. Avoid adding salt when cooking. Use salt-free seasonings or herbs instead of table salt or sea salt. Check with your health care provider or pharmacist before using salt substitutes. Cook with plant-based oils, such as canola, sunflower, or olive oil. Meal planning When eating at a restaurant, ask that your food be prepared with less salt or no salt, if possible. Avoid dishes labeled as brined, pickled, cured, smoked, or made with soy sauce, miso, or teriyaki sauce. Avoid foods that contain MSG  (monosodium glutamate). MSG is sometimes added to [place] food, bouillon, and some canned foods. Make meals that can be grilled, baked, poached, roasted, or steamed. These are generally made with less sodium. General information Most people on this plan should limit their sodium intake to 1,500-2,000 mg (milligrams) of sodium each day. What foods should I eat? Fruits Fresh, frozen, or canned fruit. Fruit juice. Vegetables Fresh or frozen vegetables. "No salt added" canned vegetables. "No salt added" tomato sauce and paste. Low-sodium or reduced-sodium tomato and vegetable juice. Grains Low-sodium cereals, including oats, puffed wheat and rice, and shredded wheat. Low-sodium crackers. Unsalted rice. Unsalted pasta. Low-sodium bread. Whole-grain breads and whole-grain pasta. Meats and other proteins Fresh or frozen (no salt added) meat, poultry, seafood, and fish. Low-sodium canned tuna and salmon. Unsalted nuts. Dried peas, beans, and lentils without added salt. Unsalted canned beans. Eggs. Unsalted nut butters. Dairy Milk. Soy milk. Cheese that is naturally low in sodium, such as ricotta cheese, fresh mozzarella, or Swiss cheese. Low-sodium or reduced-sodium cheese. Cream cheese. Yogurt. Seasonings and condiments Fresh and dried herbs and spices. Salt-free seasonings. Low-sodium mustard and ketchup. Sodium-free salad dressing. Sodium-free light mayonnaise. Fresh or refrigerated horseradish.  Lemon juice. Vinegar. Other foods Homemade, reduced-sodium, or low-sodium soups. Unsalted popcorn and pretzels. Low-salt or salt-free chips. The items listed above may not be a complete list of foods and beverages you can eat. Contact a dietitian for more information. What foods should I avoid? Vegetables Sauerkraut, pickled vegetables, and relishes. Olives. [place] fries. Onion rings. Regular canned vegetables (not low-sodium or reduced-sodium). Regular canned tomato sauce and paste (not low-sodium or  reduced-sodium). Regular tomato and vegetable juice (not low-sodium or reduced-sodium). Frozen vegetables in sauces. Grains Instant hot cereals. Bread stuffing, pancake, and biscuit mixes. Croutons. Seasoned rice or pasta mixes. Noodle soup cups. Boxed or frozen macaroni and cheese. Regular salted crackers. Self-rising flour. Meats and other proteins Meat or fish that is salted, canned, smoked, spiced, or pickled. Precooked or cured meat, such as sausages or meat loaves. Berniece Salines. Ham. Pepperoni. Hot dogs. Corned beef. Chipped beef. Salt pork. Jerky. Pickled herring. Anchovies and sardines. Regular canned tuna. Salted nuts. Dairy Processed cheese and cheese spreads. Hard cheeses. Cheese curds. Blue cheese. Feta cheese. String cheese. Regular cottage cheese. Buttermilk. Canned milk. Fats and oils Salted butter. Regular margarine. Ghee. Bacon fat. Seasonings and condiments Onion salt, garlic salt, seasoned salt, table salt, and sea salt. Canned and packaged gravies. Worcestershire sauce. Tartar sauce. Barbecue sauce. Teriyaki sauce. Soy sauce, including reduced-sodium. Steak sauce. Fish sauce. Oyster sauce. Cocktail sauce. Horseradish that you find on the shelf. Regular ketchup and mustard. Meat flavorings and tenderizers. Bouillon cubes. Hot sauce. Pre-made or packaged marinades. Pre-made or packaged taco seasonings. Relishes. Regular salad dressings. Salsa. Other foods Salted popcorn and pretzels. Corn chips and puffs. Potato and tortilla chips. Canned or dried soups. Pizza. Frozen entrees and pot pies. The items listed above may not be a complete list of foods and beverages you should avoid. Contact a dietitian for more information. Summary Eating less sodium can help lower your blood pressure, reduce swelling, and protect your heart, liver, and kidneys. Most people on this plan should limit their sodium intake to 1,500-2,000 mg (milligrams) of sodium each day. Canned, boxed, and frozen foods are high  in sodium. Restaurant foods, fast foods, and pizza are also very high in sodium. You also get sodium by adding salt to food. Try to cook at home, eat more fresh fruits and vegetables, and eat less fast food and canned, processed, or prepared foods. This information is not intended to replace advice given to you by your health care provider. Make sure you discuss any questions you have with your health care provider. Document Revised: 10/24/2019 Document Reviewed: 08/20/2019 Elsevier Patient Education  [place] Eating Plan DASH stands for Dietary Approaches to Stop Hypertension. The DASH eating plan is a healthy eating plan that has been shown to: Reduce high blood pressure (hypertension). Reduce your risk for type 2 diabetes, heart disease, and stroke. Help with weight loss. What are tips for following this plan? Reading food labels Check food labels for the amount of salt (sodium) per serving. Choose foods with less than 5 percent of the Daily Value of sodium. Generally, foods with less than 300 milligrams (mg) of sodium per serving fit into this eating plan. To find whole grains, look for the word "whole" as the first word in the ingredient list. Shopping Buy products labeled as "low-sodium" or "no salt added." Buy fresh foods. Avoid canned foods and pre-made or frozen meals. Cooking Avoid adding salt when cooking. Use salt-free seasonings or herbs instead of table salt or sea salt. Check  with your health care provider or pharmacist before using salt substitutes. Do not fry foods. Cook foods using healthy methods such as baking, boiling, grilling, roasting, and broiling instead. Cook with heart-healthy oils, such as olive, canola, avocado, soybean, or sunflower oil. Meal planning  Eat a balanced diet that includes: 4 or more servings of fruits and 4 or more servings of vegetables each day. Try to fill one-half of your plate with fruits and vegetables. 6-8 servings of whole  grains each day. Less than 6 oz (170 g) of lean meat, poultry, or fish each day. A 3-oz (85-g) serving of meat is about the same size as a deck of cards. One egg equals 1 oz (28 g). 2-3 servings of low-fat dairy each day. One serving is 1 cup (237 mL). 1 serving of nuts, seeds, or beans 5 times each week. 2-3 servings of heart-healthy fats. Healthy fats called omega-3 fatty acids are found in foods such as walnuts, flaxseeds, fortified milks, and eggs. These fats are also found in cold-water fish, such as sardines, salmon, and mackerel. Limit how much you eat of: Canned or prepackaged foods. Food that is high in trans fat, such as some fried foods. Food that is high in saturated fat, such as fatty meat. Desserts and other sweets, sugary drinks, and other foods with added sugar. Full-fat dairy products. Do not salt foods before eating. Do not eat more than 4 egg yolks a week. Try to eat at least 2 vegetarian meals a week. Eat more home-cooked food and less restaurant, buffet, and fast food. Lifestyle When eating at a restaurant, ask that your food be prepared with less salt or no salt, if possible. If you drink alcohol: Limit how much you use to: 0-1 drink a day for women who are not pregnant. 0-2 drinks a day for men. Be aware of how much alcohol is in your drink. In the [place] one drink equals one 12 oz bottle of beer (355 mL), one 5 oz glass of wine (148 mL), or one 1 oz glass of hard liquor (44 mL). General information Avoid eating more than 2,300 mg of salt a day. If you have hypertension, you may need to reduce your sodium intake to 1,500 mg a day. Work with your health care provider to maintain a healthy body weight or to lose weight. Ask what an ideal weight is for you. Get at least 30 minutes of exercise that causes your heart to beat faster (aerobic exercise) most days of the week. Activities may include walking, swimming, or biking. Work with your health care provider or dietitian  to adjust your eating plan to your individual calorie needs. What foods should I eat? Fruits All fresh, dried, or frozen fruit. Canned fruit in natural juice (without added sugar). Vegetables Fresh or frozen vegetables (raw, steamed, roasted, or grilled). Low-sodium or reduced-sodium tomato and vegetable juice. Low-sodium or reduced-sodium tomato sauce and tomato paste. Low-sodium or reduced-sodium canned vegetables. Grains Whole-grain or whole-wheat bread. Whole-grain or whole-wheat pasta. Brown rice. [place]. Bulgur. Whole-grain and low-sodium cereals. Pita bread. Low-fat, low-sodium crackers. Whole-wheat flour tortillas. Meats and other proteins Skinless chicken or [place]. Ground chicken or [place]. Pork with fat trimmed off. Fish and seafood. Egg whites. Dried beans, peas, or lentils. Unsalted nuts, nut butters, and seeds. Unsalted canned beans. Lean cuts of beef with fat trimmed off. Low-sodium, lean precooked or cured meat, such as sausages or meat loaves. Dairy Low-fat (1%) or fat-free (skim) milk. Reduced-fat, low-fat, or fat-free  cheeses. Nonfat, low-sodium ricotta or cottage cheese. Low-fat or nonfat yogurt. Low-fat, low-sodium cheese. Fats and oils Soft margarine without trans fats. Vegetable oil. Reduced-fat, low-fat, or light mayonnaise and salad dressings (reduced-sodium). Canola, safflower, olive, avocado, soybean, and sunflower oils. Avocado. Seasonings and condiments Herbs. Spices. Seasoning mixes without salt. Other foods Unsalted popcorn and pretzels. Fat-free sweets. The items listed above may not be a complete list of foods and beverages you can eat. Contact a dietitian for more information. What foods should I avoid? Fruits Canned fruit in a light or heavy syrup. Fried fruit. Fruit in cream or butter sauce. Vegetables Creamed or fried vegetables. Vegetables in a cheese sauce. Regular canned vegetables (not low-sodium or reduced-sodium). Regular canned tomato sauce  and paste (not low-sodium or reduced-sodium). Regular tomato and vegetable juice (not low-sodium or reduced-sodium). [place] Fava. Olives. Grains Baked goods made with fat, such as croissants, muffins, or some breads. Dry pasta or rice meal packs. Meats and other proteins Fatty cuts of meat. Ribs. Fried meat. Berniece Salines. Bologna, salami, and other precooked or cured meats, such as sausages or meat loaves. Fat from the back of a pig (fatback). Bratwurst. Salted nuts and seeds. Canned beans with added salt. Canned or smoked fish. Whole eggs or egg yolks. Chicken or [place] with skin. Dairy Whole or 2% milk, cream, and half-and-half. Whole or full-fat cream cheese. Whole-fat or sweetened yogurt. Full-fat cheese. Nondairy creamers. Whipped toppings. Processed cheese and cheese spreads. Fats and oils Butter. Stick margarine. Lard. Shortening. Ghee. Bacon fat. Tropical oils, such as coconut, palm kernel, or palm oil. Seasonings and condiments Onion salt, garlic salt, seasoned salt, table salt, and sea salt. Worcestershire sauce. Tartar sauce. Barbecue sauce. Teriyaki sauce. Soy sauce, including reduced-sodium. Steak sauce. Canned and packaged gravies. Fish sauce. Oyster sauce. Cocktail sauce. Store-bought horseradish. Ketchup. Mustard. Meat flavorings and tenderizers. Bouillon cubes. Hot sauces. Pre-made or packaged marinades. Pre-made or packaged taco seasonings. Relishes. Regular salad dressings. Other foods Salted popcorn and pretzels. The items listed above may not be a complete list of foods and beverages you should avoid. Contact a dietitian for more information. Where to find more information National Heart, Lung, and Blood Institute: https://wilson-eaton.com/ American Heart Association: www.heart.org Academy of Nutrition and Dietetics: www.eatright.[place]: www.kidney.org Summary The DASH eating plan is a healthy eating plan that has been shown to reduce high blood pressure (hypertension).  It may also reduce your risk for type 2 diabetes, heart disease, and stroke. When on the DASH eating plan, aim to eat more fresh fruits and vegetables, whole grains, lean proteins, low-fat dairy, and heart-healthy fats. With the DASH eating plan, you should limit salt (sodium) intake to 2,300 mg a day. If you have hypertension, you may need to reduce your sodium intake to 1,500 mg a day. Work with your health care provider or dietitian to adjust your eating plan to your individual calorie needs. This information is not intended to replace advice given to you by your health care provider. Make sure you discuss any questions you have with your health care provider. Document Revised: 08/22/2019 Document Reviewed: 08/22/2019 Elsevier Patient Education  [place].

## 2022-08-14 NOTE — Progress Notes (Signed)
Date:  08/14/2022   Name:  Colton Butler   DOB:  09-12-1961   MRN:  [id number]   Chief Complaint: elevated blood pressure readings  Hypertension This is a chronic problem. The current episode started more than 1 month ago. The problem is uncontrolled. Pertinent negatives include no chest pain, palpitations or shortness of breath. There are no associated agents to hypertension. Past treatments include ACE inhibitors (lisinopril 20 mg). The current treatment provides mild improvement. There are no compliance problems.     Lab Results  Component Value Date   NA 134 (L) 01/29/2022   K 4.0 01/29/2022   CO2 21 (L) 01/29/2022   GLUCOSE 106 (H) 01/29/2022   BUN 17 01/29/2022   CREATININE 1.22 01/29/2022   CALCIUM 9.7 01/29/2022   GFRNONAA >60 01/29/2022   No results found for: "CHOL", "HDL", "LDLCALC", "LDLDIRECT", "TRIG", "CHOLHDL" No results found for: "TSH" No results found for: "HGBA1C" Lab Results  Component Value Date   WBC 9.7 01/29/2022   HGB 15.5 01/29/2022   HCT 44.8 01/29/2022   MCV 92.8 01/29/2022   PLT 290 01/29/2022   Lab Results  Component Value Date   ALT 24 01/29/2022   AST 30 01/29/2022   ALKPHOS 79 01/29/2022   BILITOT 0.8 01/29/2022   No results found for: "25OHVITD2", "25OHVITD3", "VD25OH"   Review of Systems  Constitutional:  Negative for fatigue and unexpected weight change.  Respiratory:  Negative for cough, chest tightness, shortness of breath and wheezing.   Cardiovascular:  Negative for chest pain, palpitations and leg swelling.    Patient Active Problem List   Diagnosis Date Noted   Other specified disorders of kidney and ureter 05/30/2022   Left renal mass 05/30/2022   Microcytic anemia 05/28/2019   Essential hypertension 05/28/2019   History of Barrett's esophagus 05/28/2019   History of bariatric surgery 05/28/2019    Allergies  Allergen Reactions   Aspirin Other (See Comments)    Due to gastric bypass (GI bleed)   Nsaids Other (See  Comments)    Due to gastric bypass (GI bleed)    Past Surgical History:  Procedure Laterality Date   COLONOSCOPY WITH ESOPHAGOGASTRODUODENOSCOPY (EGD)  2020   CYSTOSCOPY/RETROGRADE/URETEROSCOPY Left 03/24/2022   Procedure: CYSTOSCOPY/RETROGRADE/URETEROSCOPY;  Surgeon: Billey Co, MD;  Location: [facility];  Service: Urology;  Laterality: Left;   GASTRIC BLEED REPAIR  2008   KNEE ARTHROSCOPY Left 2002   meniscus   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  Current Meds  Medication Sig   Blood Pressure Monitoring KIT 1 each by Does not apply route daily.   CALCIUM CITRATE PO Take 600 mg by mouth daily.   cetirizine (ZYRTEC) 10 MG tablet Take 10 mg by mouth daily.   Iron, Ferrous Sulfate, 325 (65 Fe) MG TABS Take 325 mg by mouth 2 (two) times a week. Monday & Thursday   lisinopril (ZESTRIL) 20 MG tablet Take 1 tablet (20 mg total) by mouth daily.   Multiple Vitamins-Minerals (CENTRUM SILVER PO)  Take 1 tablet by mouth daily.   Omeprazole 20 MG TBEC omeprazole 20 mg capsule,delayed release  Take 1 capsule every day by oral route.   vitamin B-12 (CYANOCOBALAMIN) 1000 MCG tablet Take 1,000 mcg by mouth daily.       08/14/2022    4:08 PM 06/16/2022    2:07 PM  GAD 7 : Generalized Anxiety Score  Nervous, Anxious, on Edge 1 0  Control/stop worrying 0 0  Worry too much - different things 0 0  Trouble relaxing 0 0  Restless 0 0  Easily annoyed or irritable 0 0  Afraid - awful might happen 0 0  Total GAD 7 Score 1 0  Anxiety Difficulty Not difficult at all Not  difficult at all       08/14/2022    4:07 PM 06/16/2022    2:07 PM  Depression screen PHQ 2/9  Decreased Interest 0 0  Down, Depressed, Hopeless 0 0  PHQ - 2 Score 0 0  Altered sleeping 1 2  Tired, decreased energy 1 0  Change in appetite 0 0  Feeling bad or failure about yourself  0 0  Trouble concentrating 0 0  Moving slowly or fidgety/restless 0 0  Suicidal thoughts 0 0  PHQ-9 Score 2 2  Difficult doing work/chores Not difficult at all Very difficult    BP Readings from Last 3 Encounters:  08/14/22 (!) 128/98  06/16/22 134/88  05/30/22 (!) 146/91    Physical Exam Vitals and nursing note reviewed.  HENT:     Head: Normocephalic.     Right Ear: Tympanic membrane normal.     Left Ear: Tympanic membrane normal.     Nose: Nose normal.     Mouth/Throat:     Mouth: Mucous membranes are moist.  Eyes:     Pupils: Pupils are equal, round, and reactive to light.  Cardiovascular:     Rate and Rhythm: Normal rate.     Heart sounds: No murmur heard.    No friction rub. No gallop.  Pulmonary:     Effort: No respiratory distress.     Breath sounds: No wheezing, rhonchi or rales.  Musculoskeletal:     Cervical back: Normal range of motion.  Neurological:     Mental Status: He is alert.     Wt Readings from Last 3 Encounters:  08/14/22 287 lb (130.2 kg)  06/16/22 282 lb 6.4 oz (128.1 kg)  05/30/22 283 lb (128.4 kg)    BP (!) 128/98 (BP Location: Right Arm, Cuff Size: Large)   Pulse 84   Ht _0  (1.803 m)   Wt 287 lb (130.2 kg)   SpO2 99%   BMI 40.03 kg/m   Assessment and Plan:  1. Essential hypertension Chronic.  Persistent.  Stable.  Patient's been doing his home blood pressure readings with elevated diastolics in the 353 range.  Patient is currently on lisinopril 20 mg once a day.  We have discussed the addition of hydrochlorothiazide 12.5 mg and patient is in agreement with this.  We will recheck patient in 4 weeks on the the addition of the  hydrochlorothiazide with lisinopril and will likely check labs. - hydrochlorothiazide (HYDRODIURIL) 12.5 MG tablet; Take 1 tablet (12.5 mg total) by mouth daily.  Dispense: 90 tablet; Refill: 0    Otilio Miu, MD

## 2022-08-21 ENCOUNTER — Encounter: Payer: Self-pay | Admitting: Internal Medicine

## 2022-08-21 ENCOUNTER — Ambulatory Visit (INDEPENDENT_AMBULATORY_CARE_PROVIDER_SITE_OTHER): Payer: 59 | Admitting: Internal Medicine

## 2022-08-21 VITALS — BP 118/70 | HR 65 | Ht 71.0 in | Wt 277.0 lb

## 2022-08-21 DIAGNOSIS — Z23 Encounter for immunization: Secondary | ICD-10-CM

## 2022-08-21 DIAGNOSIS — M779 Enthesopathy, unspecified: Secondary | ICD-10-CM

## 2022-08-21 DIAGNOSIS — I10 Essential (primary) hypertension: Secondary | ICD-10-CM | POA: Diagnosis not present

## 2022-08-21 MED ORDER — PREDNISONE 10 MG PO TABS: 10.0000 mg | ORAL_TABLET | ORAL | 0 refills | Status: AC

## 2022-08-21 NOTE — Progress Notes (Signed)
Date:  08/21/2022   Name:  Colton Butler   DOB:  10/30/1960   MRN:  [id number]   Chief Complaint: Arm Pain (Right arm. Bicep into forearm. Started a few weeks ago, and now getting worse. Pain is described as throbbing and aching. Keeping patient up at night- cannot sleep due to pain level. )  Arm Pain  The incident occurred more than 1 week ago. The pain is present in the upper right arm. The quality of the pain is described as aching. The pain radiates to the right hand. The pain is at a severity of 2/10. The pain is moderate. Pertinent negatives include no chest pain. He has tried acetaminophen and heat for the symptoms. The treatment provided mild relief.  Hypertension This is a chronic problem. Pertinent negatives include no chest pain, headaches, palpitations or shortness of breath. Past treatments include ACE inhibitors and diuretics (hctz added last 2 weeks ago). The current treatment provides significant (along with exercise and low sodium diet) improvement.    Lab Results  Component Value Date   NA 134 (L) 01/29/2022   K 4.0 01/29/2022   CO2 21 (L) 01/29/2022   GLUCOSE 106 (H) 01/29/2022   BUN 17 01/29/2022   CREATININE 1.22 01/29/2022   CALCIUM 9.7 01/29/2022   GFRNONAA >60 01/29/2022   No results found for: "CHOL", "HDL", "LDLCALC", "LDLDIRECT", "TRIG", "CHOLHDL" No results found for: "TSH" No results found for: "HGBA1C" Lab Results  Component Value Date   WBC 9.7 01/29/2022   HGB 15.5 01/29/2022   HCT 44.8 01/29/2022   MCV 92.8 01/29/2022   PLT 290 01/29/2022   Lab Results  Component Value Date   ALT 24 01/29/2022   AST 30 01/29/2022   ALKPHOS 79 01/29/2022   BILITOT 0.8 01/29/2022   No results found for: "25OHVITD2", "25OHVITD3", "VD25OH"   Review of Systems  Constitutional:  Negative for fatigue and unexpected weight change.  HENT:  Negative for nosebleeds.   Eyes:  Negative for visual disturbance.  Respiratory:  Negative for cough, chest tightness,  shortness of breath and wheezing.   Cardiovascular:  Negative for chest pain, palpitations and leg swelling.  Gastrointestinal:  Negative for abdominal pain, constipation and diarrhea.  Neurological:  Negative for dizziness, weakness, light-headedness and headaches.    Patient Active Problem List   Diagnosis Date Noted   Other specified disorders of kidney and ureter 05/30/2022   Left renal mass 05/30/2022   Microcytic anemia 05/28/2019   Essential hypertension 05/28/2019   History of Barrett's esophagus 05/28/2019   History of bariatric surgery 05/28/2019    Allergies  Allergen Reactions   Aspirin Other (See Comments)    Due to gastric bypass (GI bleed)   Nsaids Other (See Comments)    Due to gastric bypass (GI bleed)    Past Surgical History:  Procedure Laterality Date   COLONOSCOPY WITH ESOPHAGOGASTRODUODENOSCOPY (EGD)  2020   CYSTOSCOPY/RETROGRADE/URETEROSCOPY Left 03/24/2022   Procedure: CYSTOSCOPY/RETROGRADE/URETEROSCOPY;  Surgeon: Billey Co, MD;  Location: [facility];  Service: Urology;  Laterality: Left;   GASTRIC BLEED REPAIR  2008   KNEE ARTHROSCOPY Left 2002   meniscus   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  Current Meds  Medication Sig   Blood Pressure Monitoring KIT 1 each by Does not apply route daily.   CALCIUM CITRATE PO Take 600 mg by mouth daily.   cetirizine (ZYRTEC) 10 MG tablet Take 10 mg by mouth daily.   hydrochlorothiazide (HYDRODIURIL) 12.5 MG tablet Take 1 tablet (12.5 mg total) by mouth daily.   Iron, Ferrous Sulfate,  325 (65 Fe) MG TABS Take 325 mg by mouth 2 (two) times a week. Monday & Thursday   lisinopril (ZESTRIL) 20 MG tablet Take 1 tablet (20 mg total) by mouth daily.   Multiple Vitamins-Minerals (CENTRUM SILVER PO) Take 1 tablet by mouth daily.   Omeprazole 20 MG TBEC omeprazole 20 mg capsule,delayed release  Take 1 capsule every day by oral route.   vitamin B-12 (CYANOCOBALAMIN) 1000 MCG tablet Take 1,000 mcg by mouth daily.       08/21/2022    1:28 PM 08/14/2022    4:08 PM 06/16/2022    2:07 PM  GAD 7 : Generalized Anxiety Score  Nervous, Anxious, on Edge 0 1 0  Control/stop worrying 0 0 0  Worry too much - different things 0 0 0  Trouble relaxing 0 0 0  Restless 0 0 0  Easily annoyed or irritable 1 0 0  Afraid - awful might happen 1 0 0  Total GAD 7 Score 2 1 0  Anxiety Difficulty Somewhat difficult Not difficult at all Not difficult at all       08/21/2022    1:28 PM 08/14/2022    4:07 PM 06/16/2022    2:07 PM  Depression screen PHQ 2/9  Decreased Interest 0 0 0  Down, Depressed, Hopeless 0 0 0  PHQ - 2 Score 0 0 0  Altered sleeping _0 Tired, decreased energy 1 1 0  Change in appetite 0 0 0  Feeling bad or failure about yourself  0 0 0  Trouble concentrating 0 0 0  Moving slowly or fidgety/restless 0 0 0  Suicidal thoughts 0 0 0  PHQ-9 Score _1 Difficult doing work/chores Not difficult at all Not difficult at all Very difficult    BP Readings from Last 3 Encounters:  08/21/22 118/70  08/14/22 (!) 128/98  06/16/22 134/88    Physical Exam Vitals and nursing note reviewed.  Constitutional:      General: He is not in acute distress.    Appearance: Normal appearance. He is well-developed.  HENT:     Head: Normocephalic and atraumatic.  Cardiovascular:     Rate and Rhythm: Normal rate and regular rhythm.  Pulmonary:     Effort: Pulmonary effort is normal. No respiratory distress.     Breath sounds: No wheezing or rhonchi.  Musculoskeletal:     Right  shoulder: Tenderness present. No swelling, deformity or effusion.     Right elbow: Normal range of motion. No tenderness.     Right wrist: No tenderness or snuff box tenderness.     Cervical back: Normal range of motion.  Lymphadenopathy:     Cervical: No cervical adenopathy.  Skin:    General: Skin is warm and dry.     Findings: No rash.  Neurological:     Mental Status: He is alert and oriented to person, place, and time.  Psychiatric:  Mood and Affect: Mood normal.        Behavior: Behavior normal.     Wt Readings from Last 3 Encounters:  08/21/22 277 lb (125.6 kg)  08/14/22 287 lb (130.2 kg)  06/16/22 282 lb 6.4 oz (128.1 kg)    BP 118/70   Pulse 65   Ht _0  (1.803 m)   Wt 277 lb (125.6 kg)   SpO2 97%   BMI 38.63 kg/m   Assessment and Plan: 1. Tendonitis Of upper arm - unable to take NSAIDS Will do steroid taper and continue heat If no improvement, would refer to SM - predniSONE (DELTASONE) 10 MG tablet; Take 1 tablet (10 mg total) by mouth as directed for 6 days. Take 6,5,4,3,2,1 then stop  Dispense: 21 tablet; Refill: 0  2. Essential hypertension BP much improved with medication and lifestyle changes Pt is to continue current regimen.  3. Encounter for immunization [employer] Fall 2023 Covid-19 Vaccine 9yr and older   Partially dictated using DEditor, commissioning Any errors are unintentional.  LHalina Maidens MD [email or web address]  08/21/2022

## 2022-08-28 ENCOUNTER — Ambulatory Visit: Payer: Self-pay | Admitting: *Deleted

## 2022-08-28 NOTE — Telephone Encounter (Signed)
  Chief Complaint: New BP medication, HCTZ is causing BP to be low and feel dizzy and "off" Symptoms: see above Frequency: Since the new medication was started Pertinent Negatives: Patient denies passing out Disposition: '[]'$ ED /'[]'$ Urgent Care (no appt availability in office) / '[]'$ Appointment(In office/virtual)/ '[]'$  [facility]/ '[]'$ Home Care/ '[]'$ Refused Recommended Disposition /'[]'$ [facility]/ '[x]'$  Follow-up with PCP Additional Notes: Message sent to Dr. Army Melia with his readings.   Pt. Agreeable to someone calling him back.

## 2022-08-28 NOTE — Telephone Encounter (Signed)
Reason for Disposition  [9] Systolic BP 16-384 AND [6] taking blood pressure medications AND [3] dizzy, lightheaded or weak  Answer Assessment - Initial Assessment Questions 1. BLOOD PRESSURE: "What is the blood pressure?" "Did you take at least two measurements 5 minutes apart?"     BP low.  Dizzy.    I am feeling off.   Not losing balance.   A water pill was prescribed 2 wks ago because BP was high. I'm on the DASH diet too.   No wt. Loss. Should I continue the medication. Yesterday I was "off"   12:30 84/62 yesterday.    99/65 Yest. At 4 PM. Felt off 6:25 PM 81/58.     This morning 96/68 6:00 AM. The water pill medicine is the new pill. I'm on lisinopril  20 mg nothing changed.    2. ONSET: "When did you take your blood pressure?"     This morning 3. HOW: "How did you obtain the blood pressure?" (e.g., visiting nurse, automatic home BP monitor)     Home machine 4. HISTORY: "Do you have a history of low blood pressure?" "What is your blood pressure normally?"     Yes 5. MEDICINES: "Are you taking any medications for blood pressure?" If Yes, ask: "Have they been changed recently?"     Yes Lisinopril 20 mg and the new HCTZ medication. 6. PULSE RATE: "Do you know what your pulse rate is?"      Not asked 7. OTHER SYMPTOMS: "Have you been sick recently?" "Have you had a recent injury?"     Just feeling dizzy and "off". 8. PREGNANCY: "Is there any chance you are pregnant?" "When was your last menstrual period?"     N/A  Protocols used: Blood Pressure - Low-A-AH

## 2022-08-28 NOTE — Telephone Encounter (Signed)
Pt informed to stop HCTZ. Pt verbalized understanding.  KP

## 2022-08-30 ENCOUNTER — Ambulatory Visit
Admission: RE | Admit: 2022-08-30 | Discharge: 2022-08-30 | Disposition: A | Discharge status: Home / Self Care | Payer: 59 | Source: Ambulatory Visit | Attending: Urology | Admitting: Urology

## 2022-08-30 DIAGNOSIS — N2889 Other specified disorders of kidney and ureter: Secondary | ICD-10-CM | POA: Diagnosis not present

## 2022-08-30 DIAGNOSIS — K8020 Calculus of gallbladder without cholecystitis without obstruction: Secondary | ICD-10-CM | POA: Diagnosis not present

## 2022-08-30 LAB — POCT I-STAT CREATININE: Creatinine, Ser: 1.5 mg/dL — ABNORMAL HIGH (ref 0.61–1.24)

## 2022-08-30 MED ORDER — IOHEXOL 300 MG/ML  SOLN
100.0000 mL | Freq: Once | INTRAMUSCULAR | Status: AC | PRN
  Administered 2022-08-30: 100 mL via INTRAVENOUS

## 2022-09-05 ENCOUNTER — Ambulatory Visit (INDEPENDENT_AMBULATORY_CARE_PROVIDER_SITE_OTHER): Payer: 59 | Admitting: Urology

## 2022-09-05 ENCOUNTER — Other Ambulatory Visit: Payer: Self-pay

## 2022-09-05 ENCOUNTER — Other Ambulatory Visit: Payer: Self-pay | Admitting: Urology

## 2022-09-05 ENCOUNTER — Encounter: Payer: Self-pay | Admitting: Urology

## 2022-09-05 VITALS — BP 134/87 | HR 98 | Ht 71.0 in | Wt 277.0 lb

## 2022-09-05 DIAGNOSIS — R109 Unspecified abdominal pain: Secondary | ICD-10-CM

## 2022-09-05 DIAGNOSIS — N2889 Other specified disorders of kidney and ureter: Secondary | ICD-10-CM | POA: Diagnosis not present

## 2022-09-05 NOTE — Progress Notes (Signed)
   09/05/2022 1:03 PM   Colton Butler [birth year] [id number]  Reason for visit: Follow up left hydronephrosis, small left renal mass  HPI: 61 year old male who originally presented to the ER in April 2023 with severe left-sided flank pain and CT showed moderate left-sided hydronephrosis with no evidence of stones.  A follow-up ultrasound showed persistent left-sided hydronephrosis and he ultimately underwent cystoscopy, left retrograde pyelogram, left balloon dilation of subtle narrowing of the left distal ureter and stent placement.  Stent was removed a few weeks later.  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  He has been overall doing well since that time.  He denies any severe left-sided flank pain or significant urinary symptoms.  A follow-up renal ultrasound showed no evidence of recurrent hydronephrosis.  On his original CT there was an indeterminate 1.3 cm left lower pole lesion and follow-up CT was recommended.  I personally viewed and interpreted the follow-up CT abdomen and pelvis with and without contrast in 08/30/2022 that shows an enlarged left lower pole lesion now measuring 2 cm and shows subtle enhancement.  No evidence of metastatic disease.  We reviewed the AUA guidelines regarding small renal masses and possible etiologies including complex cyst or malignancy.  He has some significant anxiety at baseline, and I do not think he would be a good candidate for surveillance, especially with the enlargement of this lesion over just the last 6 months.  We reviewed options including referral to interventional radiology for renal mass biopsy, renal mass biopsy with simultaneous ablation, or consideration of partial nephrectomy.  With his obesity and BMI of 39 and prior abdominal surgery, not an ideal candidate for partial nephrectomy.  They are most interested in referral to interventional radiology to consider biopsy and  ablation.  -Referral to interventional radiology to consider renal mass biopsy and percutaneous ablation of 2 cm subtly enhancing left lower pole renal mass -We discussed the need for long-term imaging to evaluate recurrence pending pathology of his left renal mass, but we can evaluate for any recurrent left hydronephrosis on those images as well -RTC with urology 6 months    Billey Co, MD  [facility] [address], [place], [place] [address] [phone]

## 2022-09-07 ENCOUNTER — Ambulatory Visit
Admission: RE | Admit: 2022-09-07 | Discharge: 2022-09-07 | Disposition: A | Discharge status: Home / Self Care | Payer: 59 | Source: Ambulatory Visit | Attending: Urology | Admitting: Urology

## 2022-09-07 VITALS — BP 132/89 | HR 90 | Temp 97.9°F | Resp 15

## 2022-09-07 DIAGNOSIS — N2889 Other specified disorders of kidney and ureter: Secondary | ICD-10-CM | POA: Diagnosis not present

## 2022-09-07 HISTORY — PX: IR RADIOLOGIST EVAL & MGMT: IMG5224

## 2022-09-07 NOTE — H&P (Signed)
Interventional Radiology - Clinic Visit, Initial H&P    Referring Provider: Billey Co, MD  Reason for Visit: Left renal mass    History of Present Illness  Colton Butler is a 61 y.o. male with a relevant past medical history of s/p Roux-en-Y gastric bypass seen today in Interventional Radiology clinic for enlarging left renal lower pole mass.  Patient had initial CT scan in April 2023 for left flank pain and suspected nephrolithiasis.  Although an obstructing stone was not identified, the patient was found to have a 1.3 cm exophytic lesion in the lower pole the left kidney at the time of imaging.  This was recommended for imaging follow-up, and repeat CT examination at the end of November 2023 demonstrated interval enlargement of this mass, which now measures up to 2.1 cm in size.    The patient was seen by Dr. [name] in Urology earlier this month, and the patient was then referred to the interventional radiology clinic for further evaluation and management of this mass and discussion of possible ablation.    On presentation today, he complains of some intermittent left flank pain. Otherwise no specific urinary symptoms.    Surgical history is relevant for previous Roux-en-Y gastric bypass.  He denies any significant cardiac or pulmonary history.  He is not taking any blood thinning medication.      Additional Past Medical History Past Medical History:  Diagnosis Date   Barrett's esophagus    GERD (gastroesophageal reflux disease)    Hydronephrosis, left 03/2022   Hypertension    Microcytic anemia    Morbid obesity [facility])      Surgical History  Past Surgical History:  Procedure Laterality Date   COLONOSCOPY WITH ESOPHAGOGASTRODUODENOSCOPY (EGD)  2020   CYSTOSCOPY/RETROGRADE/URETEROSCOPY Left 03/24/2022   Procedure: CYSTOSCOPY/RETROGRADE/URETEROSCOPY;  Surgeon: Billey Co, MD;  Location: [facility] ORS;  Service: Urology;  Laterality: Left;   GASTRIC BLEED REPAIR  2008    KNEE ARTHROSCOPY Left 2002   meniscus   ROUX-EN-Y GASTRIC BYPASS  07/2004   URETEROSCOPY Left 03/24/2022   Procedure: DIAGNOSTIC URETEROSCOPY;  Surgeon: Billey Co, MD;  Location: [facility] ORS;  Service: Urology;  Laterality: Left;     Medications  I have reviewed the current medication list. Refer to chart for details. Current Outpatient Medications  Medication Instructions   Blood Pressure Monitoring KIT 1 each, Does not apply, Daily   CALCIUM CITRATE PO 600 mg, Oral, Daily   cetirizine (ZYRTEC) 10 mg, Oral, Daily   cyanocobalamin (VITAMIN B12) 1,000 mcg, Oral, Daily   Iron (Ferrous Sulfate) 325 mg, Oral, 2 times weekly, Monday & Thursday   lisinopril (ZESTRIL) 20 mg, Oral, Daily   Multiple Vitamins-Minerals (CENTRUM SILVER PO) 1 tablet, Oral, Daily   Omeprazole 20 MG TBEC omeprazole 20 mg capsule,delayed release  Take 1 capsule every day by oral route.      Allergies Allergies  Allergen Reactions   Aspirin Other (See Comments)    Due to gastric bypass (GI bleed)   Nsaids Other (See Comments)    Due to gastric bypass (GI bleed)   Does patient have contrast allergy: No     Physical Exam Current Vitals Temp: 97.9 F (36.6 C) (Temp Source: Oral)  Pulse Rate: 90  Resp: 15  BP: 132/89  SpO2: 97 %        There is no height or weight on file to calculate BMI.  General: Alert and answers questions appropriately. No apparent distress. HEENT: Normocephalic, atraumatic. Conjunctivae normal without  scleral icterus. Cardiac: Regular rate and rhythm.  Pulmonary: Normal work of breathing. On room air. Abdominal: Soft without distension. Extremities: Normally-formed, well perfused.    Pertinent Lab Results    Latest Ref Rng & Units 01/29/2022    8:55 PM  CBC  WBC 4.0 - 10.5 K/uL 9.7   Hemoglobin 13.0 - 17.0 g/dL 15.5   Hematocrit 39.0 - 52.0 % 44.8   Platelets 150 - 400 K/uL 290       Latest Ref Rng & Units 08/30/2022    9:32 AM 01/29/2022    8:55 PM  CMP   Glucose 70 - 99 mg/dL  106   BUN 8 - 23 mg/dL  17   Creatinine 0.61 - 1.24 mg/dL 1.50  1.22   Sodium 135 - 145 mmol/L  134   Potassium 3.5 - 5.1 mmol/L  4.0   Chloride 98 - 111 mmol/L  103   CO2 22 - 32 mmol/L  21   Calcium 8.9 - 10.3 mg/dL  9.7   Total Protein 6.5 - 8.1 g/dL  8.2   Total Bilirubin 0.3 - 1.2 mg/dL  0.8   Alkaline Phos 38 - 126 U/L  79   AST 15 - 41 U/L  30   ALT 0 - 44 U/L  24       Relevant and/or Recent Imaging: CT 08/30/2022 IMPRESSION: 1. Poorly enhancing mass of the inferior pole of the left kidney, which is clearly enlarged compared to prior examination dated 01/29/2022, measuring 2.1 x 2.0 cm. 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 2. No evidence of renal vein invasion, lymphadenopathy or metastatic disease in the abdomen or pelvis. 3. Cholelithiasis. 4. Status post Roux type gastric bypass. Electronically Signed   By: Delanna Ahmadi M.D.   On: 08/30/2022 21:36    Assessment & Plan Colton Butler is a 61 y.o. male referred to IR Clinic by Dr. [name] in consultation for further evaluation and management of enlarging left renal lower pole mass.  Based on imaging, agree that the enlarging left renal lower pole mass most likely represents an enlarging renal cell carcinoma (RCC).  Based on treatment options discussed, I think that the patient is a good candidate for image guided ablation.  Based on posterior and peripheral location of the mass, will plan on CT-guided microwave ablation with anesthesia. This was discussed with the patient and his wife.  We discussed the details of the procedure, including its risks, benefits and alternatives, and the patient voiced is understanding.  He is agreeable to proceed with image guided ablation.    Risk stratification: ASA Classification: ASA 2 - Patient with mild systemic disease with no functional limitations    Pre-procedure planning:  Procedure: CT-guided microwave ablation  with Anesthesia  Post-procedure disposition: Outpatient, [facility] hospital   Sedation plan:  Anesthesia Medication holds: per Anesthesia   Positioning/access site: Prone in CT   Labs needed on or before procedure day: needs updated labs within 6 weeks       Total time spent on today's visit was over  80 Minutes, including both face-to-face time and non face-to-face time, personally spent on review of chart (including labs and relevant imaging), discussing further workup and treatment options, referral to specialist if needed, reviewing outside records if pertinent, answering patient questions, and coordinating care regarding left renal mass as well as management strategy.      Albin Felling, MD  Vascular and Interventional Radiology 09/07/2022 9:53 AM

## 2022-09-13 ENCOUNTER — Ambulatory Visit: Payer: 59 | Admitting: Internal Medicine

## 2022-09-22 ENCOUNTER — Other Ambulatory Visit (HOSPITAL_COMMUNITY): Payer: Self-pay | Admitting: Interventional Radiology

## 2022-09-22 DIAGNOSIS — C642 Malignant neoplasm of left kidney, except renal pelvis: Secondary | ICD-10-CM

## 2022-09-26 ENCOUNTER — Other Ambulatory Visit: Payer: Self-pay

## 2022-09-26 ENCOUNTER — Encounter (HOSPITAL_COMMUNITY): Payer: Self-pay | Admitting: Interventional Radiology

## 2022-09-26 ENCOUNTER — Other Ambulatory Visit: Payer: Self-pay | Admitting: Radiology

## 2022-09-26 ENCOUNTER — Other Ambulatory Visit (HOSPITAL_COMMUNITY): Payer: Self-pay | Admitting: Interventional Radiology

## 2022-09-26 DIAGNOSIS — N2889 Other specified disorders of kidney and ureter: Secondary | ICD-10-CM | POA: Diagnosis not present

## 2022-09-26 DIAGNOSIS — C642 Malignant neoplasm of left kidney, except renal pelvis: Secondary | ICD-10-CM

## 2022-09-26 NOTE — Progress Notes (Signed)
COVID Vaccine Completed:  Yes  Date of COVID positive in last 90 days:  No  PCP - Halina Maidens, MD Cardiologist - N/A  Chest x-ray - 10-24-21 Epic EKG - 03-27-22 Epic Stress Test - N/A ECHO - N/A Cardiac Cath - N/A Pacemaker/ICD device last checked: Spinal Cord Stimulator:  N/A  Bowel Prep - N/A  Sleep Study - N/A CPAP -   Fasting Blood Sugar -  N/A Checks Blood Sugar _____ times a day  Last dose of GLP1 agonist-  N/A GLP1 instructions:  N/A   Last dose of SGLT-2 inhibitors-  N/A SGLT-2 instructions: N/A  Blood Thinner Instructions:  N/A Aspirin Instructions: Last Dose:  Activity level:  Can go up a flight of stairs and perform activities of daily living without stopping and without symptoms of chest pain or shortness of breath.  Anesthesia review:  N/A  Patient denies shortness of breath, fever, cough and chest pain at PAT appointment  Patient verbalized understanding of instructions that were given to them at the PAT appointment. Patient was also instructed that they will need to review over the PAT instructions again at home before surgery.

## 2022-09-26 NOTE — H&P (Signed)
Referring Physician(s): Sninsky,B  Supervising Physician: Michaelle Birks  Patient Status:  [facility] OP  Chief Complaint: Left renal mass   Subjective: Pt known to IR service from consultation with Dr. Denna Haggard on 09/07/22 to discuss treatment options for an enlarging left lower pole renal mass concerning for renal cell cancer.  Latest CT A/P done on 08/30/22 revealed:  1. Poorly enhancing mass of the inferior pole of the left kidney, which is clearly enlarged compared to prior examination dated 01/29/2022, measuring 2.1 x 2.0 cm. 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 2. No evidence of renal vein invasion, lymphadenopathy or metastatic disease in the abdomen or pelvis. 3. Cholelithiasis. 4. Status post Roux type gastric bypass.  PMH also sig for Barrett's esophagus, GERD, HTN, anemia and prior Roux-en-Y gastric bypass. Following d/w Dr. Denna Haggard as well as Dr. Maryelizabeth Kaufmann pt was deemed an appropriate candidate for image guided cryoablation/possible biopsy of the left renal mass and presents today for the procedure. He currently denies fever, HA,CP,dyspnea, cough , abd/back pain,N/V or bleeding.     Past Medical History:  Diagnosis Date   Barrett's esophagus    GERD (gastroesophageal reflux disease)    Hydronephrosis, left 03/2022   Hypertension    Microcytic anemia    Morbid obesity ([place])    Past Surgical History:  Procedure Laterality Date   COLONOSCOPY WITH ESOPHAGOGASTRODUODENOSCOPY (EGD)  2020   CYSTOSCOPY/RETROGRADE/URETEROSCOPY Left 03/24/2022   Procedure: CYSTOSCOPY/RETROGRADE/URETEROSCOPY;  Surgeon: Billey Co, MD;  Location: [facility] ORS;  Service: Urology;  Laterality: Left;   GASTRIC BLEED REPAIR  2008   IR RADIOLOGIST EVAL & MGMT  09/07/2022   KNEE ARTHROSCOPY Left 2002   meniscus   ROUX-EN-Y GASTRIC BYPASS  07/2004   URETEROSCOPY Left 03/24/2022   Procedure: DIAGNOSTIC URETEROSCOPY;  Surgeon: Billey Co, MD;  Location: [facility]  ORS;  Service: Urology;  Laterality: Left;     Allergies: Aspirin and Nsaids  Medications: Prior to Admission medications   Medication Sig Start Date End Date Taking? Authorizing Provider  Acetaminophen 167 MG/5ML LIQD Take 30 mLs by mouth 3 (three) times daily as needed (pain).   Yes [provider]  CALCIUM CITRATE PO Take 600 mg by mouth daily.   Yes [provider]  cetirizine (ZYRTEC) 10 MG tablet Take 10 mg by mouth daily.   Yes [provider]  Iron, Ferrous Sulfate, 325 (65 Fe) MG TABS Take 325 mg by mouth 2 (two) times a week. Monday & Thursday   Yes [provider]  lisinopril (ZESTRIL) 20 MG tablet Take 1 tablet (20 mg total) by mouth daily. Patient taking differently: Take 5 mg by mouth See admin instructions. Take 5 mg every two to three days 06/16/22 12/13/22 Yes [name], MD  Menthol, Topical Analgesic, (ICY HOT PAIN RELIEVING EX) Apply 1 patch topically daily as needed (pain).   Yes [provider]  Menthol, Topical Analgesic, (ICY HOT) 5 % PTCH Apply 1 patch topically daily as needed (pain).   Yes [provider]  Multiple Vitamins-Minerals (CENTRUM SILVER PO) Take 1 tablet by mouth daily.   Yes [provider]  Omeprazole 20 MG TBEC Take 20 mg by mouth daily.   Yes [provider]  vitamin B-12 (CYANOCOBALAMIN) 1000 MCG tablet Take 1,000 mcg by mouth daily.   Yes [provider]  Blood Pressure Monitoring KIT 1 each by Does not apply route daily. 08/04/22   [name], MD  Vital Signs: Vitals:   09/27/22 0951  BP: (!) 137/97  Pulse: 89  Resp: 15  Temp: 98.1 F (36.7 C)  SpO2: 96%      Physical Exam awake/alert; chest- CTA bilat; heart- RRR; abd- soft,+BS,NT; no LE edema  Imaging: No results found.  Labs:  CBC: Recent Labs    01/29/22 2055  WBC 9.7  HGB 15.5  HCT 44.8  PLT 290    COAGS: No results for input(s): "INR", "APTT" in the last 8760  hours.  BMP: Recent Labs    01/29/22 2055 08/30/22 0932  NA 134*  --   K 4.0  --   CL 103  --   CO2 21*  --   GLUCOSE 106*  --   BUN 17  --   CALCIUM 9.7  --   CREATININE 1.22 1.50*  GFRNONAA >60  --     LIVER FUNCTION TESTS: Recent Labs    01/29/22 2055  BILITOT 0.8  AST 30  ALT 24  ALKPHOS 79  PROT 8.2*  ALBUMIN 4.7    Assessment and Plan: Pt known to IR service from consultation with Dr. Denna Haggard on 09/07/22 to discuss treatment options for an enlarging left lower pole renal mass concerning for renal cell cancer.  Latest CT A/P done on 08/30/22 revealed:  1. Poorly enhancing mass of the inferior pole of the left kidney, which is clearly enlarged compared to prior examination dated 01/29/2022, measuring 2.1 x 2.0 cm. 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 2. No evidence of renal vein invasion, lymphadenopathy or metastatic disease in the abdomen or pelvis. 3. Cholelithiasis. 4. Status post Roux type gastric bypass.  PMH also sig for Barrett's esophagus, GERD, HTN, anemia and prior Roux-en-Y gastric bypass. Following d/w Dr. Denna Haggard as well as Dr. Maryelizabeth Kaufmann pt was deemed an appropriate candidate for image guided cryoablation/possible biopsy of the left renal mass and presents today for the procedure. Risks and benefits of procedure was discussed with the patient /spouse including, but not limited to bleeding, infection, damage to adjacent structures , anesthesia related complications.  All of the questions were answered and there is agreement to proceed.  Consent signed and in chart.  LABS PENDING  Electronically Signed: D. Rowe Robert, PA-C 09/26/2022, 2:03 PM   I spent a total of 30 minutes at the the patient's bedside AND on the patient's hospital floor or unit, greater than 50% of which was counseling/coordinating care for image guided cryoablation of left renal mass

## 2022-09-27 ENCOUNTER — Observation Stay (HOSPITAL_COMMUNITY)
Admission: RE | Admit: 2022-09-27 | Discharge: 2022-09-27 | Disposition: A | Discharge status: Home / Self Care | Payer: 59 | Source: Ambulatory Visit | Attending: Interventional Radiology | Admitting: Interventional Radiology

## 2022-09-27 ENCOUNTER — Observation Stay (HOSPITAL_COMMUNITY)
Admission: RE | Admit: 2022-09-27 | Discharge: 2022-09-28 | Disposition: A | Discharge status: Home / Self Care | Payer: 59 | Attending: Interventional Radiology | Admitting: Interventional Radiology

## 2022-09-27 ENCOUNTER — Other Ambulatory Visit: Payer: Self-pay

## 2022-09-27 ENCOUNTER — Ambulatory Visit (HOSPITAL_COMMUNITY): Payer: 59 | Admitting: Anesthesiology

## 2022-09-27 ENCOUNTER — Encounter (HOSPITAL_COMMUNITY)
Admission: RE | Disposition: A | Discharge status: Home / Self Care | Payer: Self-pay | Source: Home / Self Care | Attending: Interventional Radiology

## 2022-09-27 ENCOUNTER — Ambulatory Visit (HOSPITAL_BASED_OUTPATIENT_CLINIC_OR_DEPARTMENT_OTHER): Payer: 59 | Admitting: Anesthesiology

## 2022-09-27 ENCOUNTER — Inpatient Hospital Stay (HOSPITAL_COMMUNITY)
Admission: RE | Admit: 2022-09-27 | Discharge: 2022-09-27 | Disposition: A | Discharge status: Home / Self Care | Payer: 59 | Source: Ambulatory Visit | Attending: Interventional Radiology | Admitting: Interventional Radiology

## 2022-09-27 ENCOUNTER — Other Ambulatory Visit: Payer: Self-pay | Admitting: Radiology

## 2022-09-27 ENCOUNTER — Ambulatory Visit (HOSPITAL_COMMUNITY): Admission: RE | Admit: 2022-09-27 | Payer: 59 | Source: Ambulatory Visit

## 2022-09-27 ENCOUNTER — Encounter (HOSPITAL_COMMUNITY): Payer: Self-pay | Admitting: Interventional Radiology

## 2022-09-27 ENCOUNTER — Ambulatory Visit (HOSPITAL_COMMUNITY): Payer: 59

## 2022-09-27 DIAGNOSIS — C649 Malignant neoplasm of unspecified kidney, except renal pelvis: Secondary | ICD-10-CM | POA: Diagnosis not present

## 2022-09-27 DIAGNOSIS — I10 Essential (primary) hypertension: Secondary | ICD-10-CM | POA: Diagnosis not present

## 2022-09-27 DIAGNOSIS — N2889 Other specified disorders of kidney and ureter: Principal | ICD-10-CM | POA: Insufficient documentation

## 2022-09-27 DIAGNOSIS — C642 Malignant neoplasm of left kidney, except renal pelvis: Secondary | ICD-10-CM

## 2022-09-27 DIAGNOSIS — Z79899 Other long term (current) drug therapy: Secondary | ICD-10-CM | POA: Insufficient documentation

## 2022-09-27 DIAGNOSIS — C641 Malignant neoplasm of right kidney, except renal pelvis: Secondary | ICD-10-CM | POA: Diagnosis not present

## 2022-09-27 DIAGNOSIS — Z01818 Encounter for other preprocedural examination: Secondary | ICD-10-CM | POA: Diagnosis not present

## 2022-09-27 HISTORY — DX: Personal history of urinary calculi: Z87.442

## 2022-09-27 HISTORY — DX: Headache, unspecified: R51.9

## 2022-09-27 HISTORY — DX: Other specified disorders of kidney and ureter: N28.89

## 2022-09-27 HISTORY — PX: RADIOLOGY WITH ANESTHESIA: SHX6223

## 2022-09-27 HISTORY — DX: Unspecified osteoarthritis, unspecified site: M19.90

## 2022-09-27 LAB — CBC WITH DIFFERENTIAL/PLATELET
Abs Immature Granulocytes: 0.05 10*3/uL (ref 0.00–0.07)
Basophils Absolute: 0 10*3/uL (ref 0.0–0.1)
Basophils Relative: 1 %
Eosinophils Absolute: 0.3 10*3/uL (ref 0.0–0.5)
Eosinophils Relative: 7 %
HCT: 38.3 % — ABNORMAL LOW (ref 39.0–52.0)
Hemoglobin: 13.1 g/dL (ref 13.0–17.0)
Immature Granulocytes: 1 %
Lymphocytes Relative: 33 %
Lymphs Abs: 1.6 10*3/uL (ref 0.7–4.0)
MCH: 32.2 pg (ref 26.0–34.0)
MCHC: 34.2 g/dL (ref 30.0–36.0)
MCV: 94.1 fL (ref 80.0–100.0)
Monocytes Absolute: 0.5 10*3/uL (ref 0.1–1.0)
Monocytes Relative: 10 %
Neutro Abs: 2.4 10*3/uL (ref 1.7–7.7)
Neutrophils Relative %: 48 %
Platelets: 246 10*3/uL (ref 150–400)
RBC: 4.07 MIL/uL — ABNORMAL LOW (ref 4.22–5.81)
RDW: 13.9 % (ref 11.5–15.5)
WBC: 4.9 10*3/uL (ref 4.0–10.5)
nRBC: 0 % (ref 0.0–0.2)

## 2022-09-27 LAB — PROTIME-INR
INR: 1.2 (ref 0.8–1.2)
Prothrombin Time: 14.6 seconds (ref 11.4–15.2)

## 2022-09-27 LAB — ABO/RH: ABO/RH(D): O POS

## 2022-09-27 LAB — BASIC METABOLIC PANEL
Anion gap: 7 (ref 5–15)
BUN: 14 mg/dL (ref 8–23)
CO2: 23 mmol/L (ref 22–32)
Calcium: 9.2 mg/dL (ref 8.9–10.3)
Chloride: 109 mmol/L (ref 98–111)
Creatinine, Ser: 1.02 mg/dL (ref 0.61–1.24)
GFR, Estimated: >60 mL/min (ref >60)
Glucose, Bld: 101 mg/dL — ABNORMAL HIGH (ref 70–99)
Potassium: 3.9 mmol/L (ref 3.5–5.1)
Sodium: 139 mmol/L (ref 135–145)

## 2022-09-27 SURGERY — CT WITH ANESTHESIA
Anesthesia: General | Laterality: Left

## 2022-09-27 MED ORDER — MORPHINE SULFATE (PF) 2 MG/ML IV SOLN: 2.0000 mg | INTRAVENOUS | Status: DC | PRN

## 2022-09-27 MED ORDER — LABETALOL HCL 5 MG/ML IV SOLN
INTRAVENOUS | Status: AC
  Filled 2022-09-27: qty 4

## 2022-09-27 MED ORDER — OXYCODONE HCL 5 MG PO TABS: 10.0000 mg | ORAL_TABLET | Freq: Four times a day (QID) | ORAL | Status: DC | PRN

## 2022-09-27 MED ORDER — MIDAZOLAM HCL 2 MG/2ML IJ SOLN
INTRAMUSCULAR | Status: AC
  Filled 2022-09-27: qty 2

## 2022-09-27 MED ORDER — LORATADINE 10 MG PO TABS: 10.0000 mg | ORAL_TABLET | Freq: Every day | ORAL | Status: DC

## 2022-09-27 MED ORDER — SODIUM CHLORIDE 0.9 % IV SOLN
INTRAVENOUS | Status: AC
  Filled 2022-09-27: qty 250

## 2022-09-27 MED ORDER — LIDOCAINE 2% (20 MG/ML) 5 ML SYRINGE
INTRAMUSCULAR | Status: DC | PRN
  Administered 2022-09-27: 60 mg via INTRAVENOUS

## 2022-09-27 MED ORDER — SODIUM CHLORIDE 0.9 % IV SOLN
2.0000 g | Freq: Once | INTRAVENOUS | Status: AC
  Administered 2022-09-27: 2 g via INTRAVENOUS
  Filled 2022-09-27: qty 20

## 2022-09-27 MED ORDER — SUGAMMADEX SODIUM 500 MG/5ML IV SOLN
INTRAVENOUS | Status: DC | PRN
  Administered 2022-09-27: 300 mg via INTRAVENOUS

## 2022-09-27 MED ORDER — MIDAZOLAM HCL 5 MG/5ML IJ SOLN
INTRAMUSCULAR | Status: DC | PRN
  Administered 2022-09-27: 2 mg via INTRAVENOUS

## 2022-09-27 MED ORDER — LABETALOL HCL 5 MG/ML IV SOLN
10.0000 mg | Freq: Once | INTRAVENOUS | Status: AC
  Administered 2022-09-27: 10 mg via INTRAVENOUS

## 2022-09-27 MED ORDER — ONDANSETRON HCL 4 MG/2ML IJ SOLN
INTRAMUSCULAR | Status: DC | PRN
  Administered 2022-09-27: 4 mg via INTRAVENOUS

## 2022-09-27 MED ORDER — LACTATED RINGERS IV SOLN: INTRAVENOUS | Status: DC

## 2022-09-27 MED ORDER — ROCURONIUM BROMIDE 10 MG/ML (PF) SYRINGE
PREFILLED_SYRINGE | INTRAVENOUS | Status: DC | PRN
  Administered 2022-09-27: 60 mg via INTRAVENOUS
  Administered 2022-09-27 (×2): 40 mg via INTRAVENOUS

## 2022-09-27 MED ORDER — DEXAMETHASONE SODIUM PHOSPHATE 10 MG/ML IJ SOLN
INTRAMUSCULAR | Status: DC | PRN
  Administered 2022-09-27: 10 mg via INTRAVENOUS

## 2022-09-27 MED ORDER — FENTANYL CITRATE (PF) 250 MCG/5ML IJ SOLN
INTRAMUSCULAR | Status: AC
  Filled 2022-09-27: qty 5

## 2022-09-27 MED ORDER — SENNOSIDES-DOCUSATE SODIUM 8.6-50 MG PO TABS: 2.0000 | ORAL_TABLET | Freq: Every day | ORAL | Status: DC | PRN

## 2022-09-27 MED ORDER — ORAL CARE MOUTH RINSE: 15.0000 mL | Freq: Once | OROMUCOSAL | Status: AC

## 2022-09-27 MED ORDER — BUPIVACAINE HCL (PF) 0.5 % IJ SOLN
INTRAMUSCULAR | Status: DC | PRN
  Administered 2022-09-27: 30 mL

## 2022-09-27 MED ORDER — SODIUM CHLORIDE (PF) 0.9 % IJ SOLN
INTRAMUSCULAR | Status: AC
  Filled 2022-09-27: qty 50

## 2022-09-27 MED ORDER — DOCUSATE SODIUM 100 MG PO CAPS
100.0000 mg | ORAL_CAPSULE | Freq: Two times a day (BID) | ORAL | Status: DC
  Administered 2022-09-27 – 2022-09-28 (×2): 100 mg via ORAL
  Filled 2022-09-27 (×2): qty 1

## 2022-09-27 MED ORDER — FENTANYL CITRATE PF 50 MCG/ML IJ SOSY
PREFILLED_SYRINGE | INTRAMUSCULAR | Status: AC
  Filled 2022-09-27: qty 1

## 2022-09-27 MED ORDER — AMISULPRIDE (ANTIEMETIC) 5 MG/2ML IV SOLN: 10.0000 mg | Freq: Once | INTRAVENOUS | Status: DC | PRN

## 2022-09-27 MED ORDER — LIDOCAINE HCL 1 % IJ SOLN
INTRAMUSCULAR | Status: DC | PRN
  Administered 2022-09-27: 10 mL

## 2022-09-27 MED ORDER — CHLORHEXIDINE GLUCONATE 0.12 % MT SOLN
15.0000 mL | Freq: Once | OROMUCOSAL | Status: AC
  Administered 2022-09-27: 15 mL via OROMUCOSAL

## 2022-09-27 MED ORDER — CENTRUM SILVER PO TABS: ORAL_TABLET | Freq: Every day | ORAL | Status: DC

## 2022-09-27 MED ORDER — ACETAMINOPHEN 500 MG PO TABS
1000.0000 mg | ORAL_TABLET | Freq: Once | ORAL | Status: AC
  Administered 2022-09-27: 1000 mg via ORAL
  Filled 2022-09-27: qty 2

## 2022-09-27 MED ORDER — ONDANSETRON HCL 4 MG/2ML IJ SOLN: 4.0000 mg | Freq: Four times a day (QID) | INTRAMUSCULAR | Status: DC | PRN

## 2022-09-27 MED ORDER — PANTOPRAZOLE SODIUM 40 MG PO TBEC
40.0000 mg | DELAYED_RELEASE_TABLET | Freq: Every day | ORAL | Status: DC
  Administered 2022-09-28: 40 mg via ORAL
  Filled 2022-09-27: qty 1

## 2022-09-27 MED ORDER — VITAMIN B-12 1000 MCG PO TABS
1000.0000 ug | ORAL_TABLET | Freq: Every day | ORAL | Status: DC
  Administered 2022-09-28: 1000 ug via ORAL
  Filled 2022-09-27: qty 1

## 2022-09-27 MED ORDER — LISINOPRIL 5 MG PO TABS
5.0000 mg | ORAL_TABLET | ORAL | Status: DC
  Administered 2022-09-28: 5 mg via ORAL
  Filled 2022-09-27: qty 1

## 2022-09-27 MED ORDER — PROPOFOL 10 MG/ML IV BOLUS
INTRAVENOUS | Status: DC | PRN
  Administered 2022-09-27: 200 mg via INTRAVENOUS

## 2022-09-27 MED ORDER — FENTANYL CITRATE (PF) 100 MCG/2ML IJ SOLN
INTRAMUSCULAR | Status: DC | PRN
  Administered 2022-09-27: 100 ug via INTRAVENOUS
  Administered 2022-09-27 (×2): 50 ug via INTRAVENOUS

## 2022-09-27 MED ORDER — FENTANYL CITRATE PF 50 MCG/ML IJ SOSY
25.0000 ug | PREFILLED_SYRINGE | INTRAMUSCULAR | Status: DC | PRN
  Administered 2022-09-27: 50 ug via INTRAVENOUS

## 2022-09-27 MED ORDER — IOHEXOL 350 MG/ML SOLN
80.0000 mL | Freq: Once | INTRAVENOUS | Status: AC | PRN
  Administered 2022-09-27: 80 mL via INTRAVENOUS

## 2022-09-27 MED ORDER — ADULT MULTIVITAMIN W/MINERALS CH
1.0000 | ORAL_TABLET | Freq: Every day | ORAL | Status: DC
  Administered 2022-09-28: 1 via ORAL
  Filled 2022-09-27: qty 1

## 2022-09-27 MED ORDER — HYDRALAZINE HCL 20 MG/ML IJ SOLN
INTRAMUSCULAR | Status: DC | PRN
  Administered 2022-09-27: 5 mg via INTRAVENOUS

## 2022-09-27 NOTE — Procedures (Signed)
Vascular and Interventional Radiology Procedure Note  Patient: Axzel Rockhill DOB: [birth year] Medical Record Number: [id number] Note Date/Time: 09/27/22 5:16 PM   Performing Physician: Michaelle Birks, MD Assistant(s): None  Diagnosis: L renal mass   Procedure:  PERCUTANEOUS CRYOABLATION of LEFT RENAL MASS LEFT RENAL MASS BIOPSY    Anesthesia: General Anesthesia Complications: None Estimated Blood Loss: Minimal Specimens: Sent for Pathology   Findings:  Successful CT-guided biopsy of L renal mass. A total of 3 samples were obtained.   Successful CT-guided cryoablation using 2x IceRod 1.5 CX probes. Contrasted post-ablation CT with adequate lesional coverage.  Hemostasis of the tract was achieved using Manual Pressure.  Plan: Bed rest for 2 hours.  See detailed procedure note with images in PACS. The patient tolerated the procedure well without incident or complication and was returned to PACU in stable condition.    Michaelle Birks, MD [facility] Radiology   Pager. [place]

## 2022-09-27 NOTE — Anesthesia Procedure Notes (Signed)
Procedure Name: Intubation Date/Time: 09/27/2022 12:45 PM  Performed by: Lavina Hamman, CRNAPre-anesthesia Checklist: Patient identified, Emergency Drugs available, Suction available, Patient being monitored and Timeout performed Patient Re-evaluated:Patient Re-evaluated prior to induction Oxygen Delivery Method: Circle system utilized Preoxygenation: Pre-oxygenation with 100% oxygen Induction Type: IV induction Ventilation: Mask ventilation without difficulty Laryngoscope Size: Mac and 4 Grade View: Grade I Tube type: Oral Tube size: 7.5 mm Number of attempts: 1 Airway Equipment and Method: Stylet Placement Confirmation: ETT inserted through vocal cords under direct vision, positive ETCO2, CO2 detector and breath sounds checked- equal and bilateral Secured at: 23 cm Tube secured with: Tape Dental Injury: Teeth and Oropharynx as per pre-operative assessment  Comments: ATOI

## 2022-09-27 NOTE — Anesthesia Preprocedure Evaluation (Signed)
Anesthesia Evaluation  Patient identified by MRN, date of birth, ID band Patient awake    Reviewed: Allergy & Precautions, NPO status , Patient's Chart, lab work & pertinent test results  Airway Mallampati: II  TM Distance: >3 FB Neck ROM: Full    Dental  (+) Dental Advisory Given   Pulmonary neg pulmonary ROS   breath sounds clear to auscultation       Cardiovascular hypertension, Pt. on medications  Rhythm:Regular Rate:Normal     Neuro/Psych negative neurological ROS     GI/Hepatic Neg liver ROS,GERD  ,,  Endo/Other  negative endocrine ROS    Renal/GU Renal disease     Musculoskeletal   Abdominal   Peds  Hematology  (+) Blood dyscrasia, anemia   Anesthesia Other Findings   Reproductive/Obstetrics                              Lab Results  Component Value Date   WBC 9.7 01/29/2022   HGB 15.5 01/29/2022   HCT 44.8 01/29/2022   MCV 92.8 01/29/2022   PLT 290 01/29/2022   Lab Results  Component Value Date   CREATININE 1.50 (H) 08/30/2022   BUN 17 01/29/2022   NA 134 (L) 01/29/2022   K 4.0 01/29/2022   CL 103 01/29/2022   CO2 21 (L) 01/29/2022    Anesthesia Physical Anesthesia Plan  ASA: 2  Anesthesia Plan: General   Post-op Pain Management: Tylenol PO (pre-op)*   Induction: Intravenous  PONV Risk Score and Plan: 2 and Dexamethasone, Ondansetron and Treatment may vary due to age or medical condition  Airway Management Planned: Oral ETT  Additional Equipment: None  Intra-op Plan:   Post-operative Plan: Extubation in OR  Informed Consent: I have reviewed the patients History and Physical, chart, labs and discussed the procedure including the risks, benefits and alternatives for the proposed anesthesia with the patient or authorized representative who has indicated his/her understanding and acceptance.     Dental advisory given  Plan Discussed with:  CRNA  Anesthesia Plan Comments:          Anesthesia Quick Evaluation

## 2022-09-27 NOTE — Sedation Documentation (Signed)
Anesthesia in to sedate and monitor. 

## 2022-09-27 NOTE — Transfer of Care (Signed)
Immediate Anesthesia Transfer of Care Note  Patient: Colton Butler  Procedure(s) Performed: CT WITH ANESTHESIA GUIDED ABLATION (Left)  Patient Location: PACU  Anesthesia Type:General  Level of Consciousness: awake, alert , and oriented  Airway & Oxygen Therapy: Patient Spontanous Breathing and Patient connected to face mask oxygen  Post-op Assessment: Report given to RN and Post -op Vital signs reviewed and stable  Post vital signs: Reviewed and stable  Last Vitals:  Vitals Value Taken Time  BP 148/104 09/27/22 1526  Temp    Pulse 80 09/27/22 1529  Resp 14 09/27/22 1529  SpO2 100 % 09/27/22 1529  Vitals shown include unvalidated device data.  Last Pain:  Vitals:   09/27/22 1020  TempSrc:   PainSc: 0-No pain         Complications: No notable events documented.

## 2022-09-28 ENCOUNTER — Encounter (HOSPITAL_COMMUNITY): Payer: Self-pay | Admitting: Interventional Radiology

## 2022-09-28 ENCOUNTER — Ambulatory Visit (HOSPITAL_COMMUNITY): Admission: RE | Admit: 2022-09-28 | Payer: 59 | Source: Ambulatory Visit

## 2022-09-28 DIAGNOSIS — I10 Essential (primary) hypertension: Secondary | ICD-10-CM | POA: Diagnosis not present

## 2022-09-28 DIAGNOSIS — Z79899 Other long term (current) drug therapy: Secondary | ICD-10-CM | POA: Diagnosis not present

## 2022-09-28 DIAGNOSIS — N2889 Other specified disorders of kidney and ureter: Secondary | ICD-10-CM | POA: Diagnosis not present

## 2022-09-28 DIAGNOSIS — C649 Malignant neoplasm of unspecified kidney, except renal pelvis: Secondary | ICD-10-CM | POA: Diagnosis not present

## 2022-09-28 NOTE — TOC CM/SW Note (Signed)
Transition of Care [facility]) Screening Note  Patient Details  Name: Colton Butler Date of Birth: Dec 20, 1960  Transition of Care [facility]) CM/SW Contact:    Sherie Don, LCSW Phone Number: 09/28/2022, 10:15 AM  Transition of Care Department [facility]) has reviewed patient and no TOC needs have been identified at this time. We will continue to monitor patient advancement through interdisciplinary progression rounds. If new patient transition needs arise, please place a TOC consult.

## 2022-09-28 NOTE — Anesthesia Postprocedure Evaluation (Signed)
Anesthesia Post Note  Patient: Colton Butler  Procedure(s) Performed: CT WITH ANESTHESIA GUIDED ABLATION (Left)     Patient location during evaluation: PACU Anesthesia Type: General Level of consciousness: awake and alert Pain management: pain level controlled Vital Signs Assessment: post-procedure vital signs reviewed and stable Respiratory status: spontaneous breathing, nonlabored ventilation, respiratory function stable and patient connected to nasal cannula oxygen Cardiovascular status: blood pressure returned to baseline and stable Postop Assessment: no apparent nausea or vomiting Anesthetic complications: no   No notable events documented.  Last Vitals:  Vitals:   09/28/22 0605 09/28/22 0957  BP: 107/80 111/71  Pulse: 83 91  Resp: 17 16  Temp: 36.8 C 36.6 C  SpO2: 95% 96%    Last Pain:  Vitals:   09/28/22 0958  TempSrc:   PainSc: 3                  Tiajuana Amass

## 2022-09-28 NOTE — Discharge Instructions (Signed)
May resume home medications, stay well-hydrated, avoid strenuous activity for 1 week ?

## 2022-09-28 NOTE — Discharge Summary (Addendum)
Patient ID: Colton Butler MRN: [id number] DOB/AGE: 62/24/1962 62 y.o.  Admit date: 09/27/2022 Discharge date: 09/28/2022  Supervising Physician: Maryelizabeth Kaufmann, Jon/Hassell,D  Patient Status: [facility] - In-pt  Admission Diagnoses: Left renal mass  Discharge Diagnoses: Left renal mass, status post CT-guided core biopsy and cryoablation on 09/27/2022 via general anesthesia Principal Problem:   Left renal mass  Past Medical History:  Diagnosis Date   Arthritis    Barrett's esophagus    GERD (gastroesophageal reflux disease)    Headache    History of kidney stones    Hydronephrosis, left 03/2022   Hypertension    Left renal mass    Microcytic anemia    Morbid obesity ([place])    Past Surgical History:  Procedure Laterality Date   COLONOSCOPY WITH ESOPHAGOGASTRODUODENOSCOPY (EGD)  2020   CYSTOSCOPY/RETROGRADE/URETEROSCOPY Left 03/24/2022   Procedure: CYSTOSCOPY/RETROGRADE/URETEROSCOPY;  Surgeon: Billey Co, MD;  Location: [facility] ORS;  Service: Urology;  Laterality: Left;   GASTRIC BLEED REPAIR  2008   IR RADIOLOGIST EVAL & MGMT  09/07/2022   KNEE ARTHROSCOPY Left 2002   meniscus   ROUX-EN-Y GASTRIC BYPASS  07/2004   URETEROSCOPY Left 03/24/2022   Procedure: DIAGNOSTIC URETEROSCOPY;  Surgeon: Billey Co, MD;  Location: [facility] ORS;  Service: Urology;  Laterality: Left;     Discharged Condition: good  Hospital Course: Colton Butler is a 61 year old male who was seen by IR team on 09/07/2022 to discuss treatment options for an enlarging left lower pole renal mass concerning for renal cell cancer.  He was deemed an appropriate candidate for CT-guided biopsy and cryoablation of the left renal mass and underwent the procedure at [facility] on 09/27/2022 via general anesthesia.  The procedure was performed without immediate complications and the pt was subsequently admitted for overnight observation.  Overnight the patient did fairly well with exception of some mild left flank  discomfort and headache.  He did not get very much sleep secondary to noise next-door.  On the day of discharge he was stable.  He denied fever, chest pain, dyspnea, cough, abdominal pain, nausea, vomiting or bleeding/hematuria.  Headache was minimal.  He did have some minimal left flank discomfort at insertion site but no discrete hematoma.  He was able to void, ambulate and tolerate his diet without significant difficulty.  He was deemed stable for discharge at this time.  We will follow-up with patient in IR clinic in 1 month.  He will continue follow-up with urology as scheduled.  Patient was told to contact our service with any additional questions.  Consults: anesthesia  Significant Diagnostic Studies:  Results for orders placed or performed during the hospital encounter of 09/27/22  CBC with Differential/Platelet  Result Value Ref Range   WBC 4.9 4.0 - 10.5 K/uL   RBC 4.07 (L) 4.22 - 5.81 MIL/uL   Hemoglobin 13.1 13.0 - 17.0 g/dL   HCT 38.3 (L) 39.0 - 52.0 %   MCV 94.1 80.0 - 100.0 fL   MCH 32.2 26.0 - 34.0 pg   MCHC 34.2 30.0 - 36.0 g/dL   RDW 13.9 11.5 - 15.5 %   Platelets 246 150 - 400 K/uL   nRBC 0.0 0.0 - 0.2 %   Neutrophils Relative % 48 %   Neutro Abs 2.4 1.7 - 7.7 K/uL   Lymphocytes Relative 33 %   Lymphs Abs 1.6 0.7 - 4.0 K/uL   Monocytes Relative 10 %   Monocytes Absolute 0.5 0.1 - 1.0 K/uL   Eosinophils Relative 7 %  Eosinophils Absolute 0.3 0.0 - 0.5 K/uL   Basophils Relative 1 %   Basophils Absolute 0.0 0.0 - 0.1 K/uL   Immature Granulocytes 1 %   Abs Immature Granulocytes 0.05 0.00 - 0.07 K/uL  Protime-INR  Result Value Ref Range   Prothrombin Time 14.6 11.4 - 15.2 seconds   INR 1.2 0.8 - 1.2  Basic metabolic panel  Result Value Ref Range   Sodium 139 135 - 145 mmol/L   Potassium 3.9 3.5 - 5.1 mmol/L   Chloride 109 98 - 111 mmol/L   CO2 23 22 - 32 mmol/L   Glucose, Bld 101 (H) 70 - 99 mg/dL   BUN 14 8 - 23 mg/dL   Creatinine, Ser 1.02 0.61 - 1.24 mg/dL    Calcium 9.2 8.9 - 10.3 mg/dL   GFR, Estimated >60 >60 mL/min   Anion gap 7 5 - 15  Type and screen  Result Value Ref Range   ABO/RH(D) O POS    Antibody Screen POS    Sample Expiration 09/30/2022,2359    Antibody Identification ANTI FYA (Duffy a)    PT AG Type NEGATIVE FOR DUFFY A ANTIGEN    Unit Number [id number]    Blood Component Type RED CELLS,LR    Unit division 00    Status of Unit ALLOCATED    Donor AG Type NEGATIVE FOR DUFFY A ANTIGEN    Transfusion Status OK TO TRANSFUSE    Crossmatch Result COMPATIBLE   ABO/Rh  Result Value Ref Range   ABO/RH(D)      O POS Performed at [facility] [place] [address], [place], [place] [address]   [facility]  Result Value Ref Range   Blood Product Unit Number [id number]    PRODUCT CODE [id number]    Unit Type and Rh 9500    Blood Product Expiration Date [id number]      Treatments: CT-guided cryoablation and core biopsy of left renal mass via general anesthesia on 09/27/2022  Discharge Exam: Blood pressure 111/71, pulse 91, temperature 97.9 F (36.6 C), resp. rate 16, height _0  (1.803 m), weight 280 lb (127 kg), SpO2 96 %. Patient awake, alert.  Chest clear to auscultation bilaterally.  Heart with regular rate and rhythm.  Abdomen obese, soft, positive bowel sounds, nontender.  Puncture site left flank clean, dry, minimal tenderness to palpation, no superficial/discrete hematoma noted.  No significant lower extremity edema.  Disposition: Discharge disposition: 01-Home or Self Care       Discharge Instructions     Call MD for:  difficulty breathing, headache or visual disturbances   Complete by: As directed    Call MD for:  extreme fatigue   Complete by: As directed    Call MD for:  hives   Complete by: As directed    Call MD for:  persistant dizziness or light-headedness   Complete by: As directed    Call MD for:  persistant nausea and vomiting   Complete by: As directed    Call MD for:   redness, tenderness, or signs of infection (pain, swelling, redness, odor or green/yellow discharge around incision site)   Complete by: As directed    Call MD for:  severe uncontrolled pain   Complete by: As directed    Call MD for:  temperature >100.4   Complete by: As directed    Change dressing (specify)   Complete by: As directed    May apply Band-Aid to puncture site left flank daily for the  next 2 to 3 days.  May wash site with soap and water.   Diet - low sodium heart healthy   Complete by: As directed    Discharge instructions   Complete by: As directed    Stay well-hydrated, avoid strenuous activity for 1 week , may resume home medications   Driving Restrictions   Complete by: As directed    No driving for the next 24 hours   Increase activity slowly   Complete by: As directed    Lifting restrictions   Complete by: As directed    No heavy lifting or strenuous activity for 1 week   May shower / Bathe   Complete by: As directed    May walk up steps   Complete by: As directed       Allergies as of 09/28/2022       Reactions   Aspirin Other (See Comments)   Due to gastric bypass (GI bleed)   Nsaids Other (See Comments)   Due to gastric bypass (GI bleed)        Medication List     TAKE these medications    Acetaminophen 167 MG/5ML Liqd Take 30 mLs by mouth 3 (three) times daily as needed (pain).   Blood Pressure Monitoring Kit 1 each by Does not apply route daily.   CALCIUM CITRATE PO Take 600 mg by mouth daily.   CENTRUM SILVER PO Take 1 tablet by mouth daily.   cetirizine 10 MG tablet Commonly known as: ZYRTEC Take 10 mg by mouth daily.   cyanocobalamin 1000 MCG tablet Commonly known as: VITAMIN B12 Take 1,000 mcg by mouth daily.   ICY HOT PAIN RELIEVING EX Apply 1 patch topically daily as needed (pain).   Icy Hot 5 % Ptch Generic drug: Menthol (Topical Analgesic) Apply 1 patch topically daily as needed (pain).   Iron (Ferrous Sulfate)  325 (65 Fe) MG Tabs Take 325 mg by mouth 2 (two) times a week. Monday & Thursday   lisinopril 20 MG tablet Commonly known as: ZESTRIL Take 1 tablet (20 mg total) by mouth daily. What changed:  how much to take when to take this additional instructions   Omeprazole 20 MG Tbec Take 20 mg by mouth daily.               Discharge Care Instructions  (From admission, onward)           Start     Ordered   09/28/22 0000  Change dressing (specify)       Comments: May apply Band-Aid to puncture site left flank daily for the next 2 to 3 days.  May wash site with soap and water.   09/28/22 1102            Follow-up Information     Mugweru, Wille Glaser, MD Follow up.   Specialties: Interventional Radiology, Diagnostic Radiology, Radiology Why: Radiology team will contact you regarding clinic follow-up with Dr. Maryelizabeth Kaufmann in 1 month; call [phone] or [phone] with any questions Contact information: [address] [address] [phone]         Billey Co, MD Follow up.   Specialty: Urology Why: Follow-up with Dr. [name] as scheduled Contact information: [place] [address] [phone]                  Electronically Signed: D. Rowe Robert, PA-C 09/28/2022, 11:05 AM   I have spent Less Than 30 Minutes discharging  Colton Butler.

## 2022-09-29 LAB — SURGICAL PATHOLOGY

## 2022-10-01 ENCOUNTER — Encounter: Payer: Self-pay | Admitting: Internal Medicine

## 2022-10-01 LAB — TYPE AND SCREEN
ABO/RH(D): O POS
Antibody Screen: POSITIVE
Donor AG Type: NEGATIVE
PT AG Type: NEGATIVE
Unit division: 0

## 2022-10-01 LAB — BPAM RBC
Blood Product Expiration Date: 202401242359
Unit Type and Rh: 9500

## 2022-10-19 ENCOUNTER — Telehealth: Payer: Self-pay | Admitting: Internal Medicine

## 2022-10-19 NOTE — Progress Notes (Signed)
Patient ID: Colton Butler, male   DOB: [birth year], 62 y.o.   MRN: [id number]  Received message that pt was experiencing pain and burning on operative side from left renal ablation procedure 09/27/22. Pt call was returned. Pt reports more of an intermittent "nuisance" sensation of burning to left side but no pain. He denies fever, chills, hematuria, dysuria or other symptoms. Pt was advised that all of these symptoms would be concerning. Pt educated to call if he has a change in his symptoms. Otherwise, he will f/u in clinic as planned.     Narda Rutherford, AGNP-BC 10/19/2022, 2:21 PM

## 2022-10-25 ENCOUNTER — Encounter: Payer: Self-pay | Admitting: Internal Medicine

## 2022-10-25 ENCOUNTER — Ambulatory Visit (INDEPENDENT_AMBULATORY_CARE_PROVIDER_SITE_OTHER): Payer: 59 | Admitting: Internal Medicine

## 2022-10-25 VITALS — BP 122/86 | HR 73 | Ht 71.0 in | Wt 287.0 lb

## 2022-10-25 DIAGNOSIS — Z0000 Encounter for general adult medical examination without abnormal findings: Secondary | ICD-10-CM | POA: Diagnosis not present

## 2022-10-25 DIAGNOSIS — Z13220 Encounter for screening for lipoid disorders: Secondary | ICD-10-CM | POA: Diagnosis not present

## 2022-10-25 DIAGNOSIS — Z125 Encounter for screening for malignant neoplasm of prostate: Secondary | ICD-10-CM

## 2022-10-25 DIAGNOSIS — I10 Essential (primary) hypertension: Secondary | ICD-10-CM | POA: Diagnosis not present

## 2022-10-25 DIAGNOSIS — C649 Malignant neoplasm of unspecified kidney, except renal pelvis: Secondary | ICD-10-CM

## 2022-10-25 MED ORDER — LISINOPRIL 10 MG PO TABS: 10.0000 mg | ORAL_TABLET | Freq: Every day | ORAL | 1 refills | Status: DC

## 2022-10-25 NOTE — Assessment & Plan Note (Addendum)
Clinically stable exam with fairly controlled BP on lisinopril 5 mg. Tolerating medications without side effects. Recommend increasing to 10 mg for a goal < 130/80

## 2022-10-25 NOTE — Progress Notes (Signed)
Date:  10/25/2022   Name:  Colton Butler   DOB:  Oct 10, 1960   MRN:  [id number]   Chief Complaint: Annual Exam Colton Butler is a 62 y.o. male who presents today for his Complete Annual Exam. He feels well. He reports exercising some. He reports he is sleeping well.   Colonoscopy: 06/2019 repeat 10 yrs  Immunization History  Administered Date(s) Administered   COVID-19, mRNA, vaccine(Comirnaty)12 years and older 08/21/2022   PFIZER(Purple Top)SARS-COV-2 Vaccination 12/19/2019, 01/13/2020   Health Maintenance Due  Topic Date Due   DTaP/Tdap/Td (1 - Tdap) Never done   COVID-19 Vaccine (4 - 2023-24 season) 10/16/2022    No results found for: "PSA1", "PSA"   Hypertension This is a chronic problem. The problem is controlled (at home diastolic 85). Pertinent negatives include no chest pain, headaches, palpitations or shortness of breath. Past treatments include ACE inhibitors. There is no history of kidney disease, CAD/MI or CVA.    Lab Results  Component Value Date   NA 139 09/27/2022   K 3.9 09/27/2022   CO2 23 09/27/2022   GLUCOSE 101 (H) 09/27/2022   BUN 14 09/27/2022   CREATININE 1.02 09/27/2022   CALCIUM 9.2 09/27/2022   GFRNONAA >60 09/27/2022   No results found for: "CHOL", "HDL", "LDLCALC", "LDLDIRECT", "TRIG", "CHOLHDL" No results found for: "TSH" No results found for: "HGBA1C" Lab Results  Component Value Date   WBC 4.9 09/27/2022   HGB 13.1 09/27/2022   HCT 38.3 (L) 09/27/2022   MCV 94.1 09/27/2022   PLT 246 09/27/2022   Lab Results  Component Value Date   ALT 24 01/29/2022   AST 30 01/29/2022   ALKPHOS 79 01/29/2022   BILITOT 0.8 01/29/2022   No results found for: "25OHVITD2", "25OHVITD3", "VD25OH"   Review of Systems  Constitutional:  Negative for appetite change, chills, diaphoresis, fatigue and unexpected weight change.  HENT:  Negative for hearing loss, tinnitus, trouble swallowing and voice change.   Eyes:  Negative for visual disturbance.   Respiratory:  Negative for choking, shortness of breath and wheezing.   Cardiovascular:  Negative for chest pain, palpitations and leg swelling.  Gastrointestinal:  Negative for abdominal pain, blood in stool, constipation, diarrhea, nausea and vomiting.  Genitourinary:  Negative for difficulty urinating, dysuria, frequency and hematuria.  Musculoskeletal:  Positive for back pain (mild left flank discomfort s/p ablation). Negative for arthralgias and myalgias.  Skin:  Negative for color change and rash.  Neurological:  Negative for dizziness, syncope and headaches.  Hematological:  Negative for adenopathy.  Psychiatric/Behavioral:  Negative for dysphoric mood and sleep disturbance. The patient is not nervous/anxious.     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[place]) 05/30/2022   Microcytic anemia 05/28/2019   Essential hypertension 05/28/2019   History of Barrett's esophagus 05/28/2019   History of bariatric surgery 05/28/2019    Allergies  Allergen Reactions   Aspirin Other (See Comments)    Due to gastric bypass (GI bleed)   Nsaids Other (See Comments)    Due to gastric bypass (GI bleed)    Past Surgical History:  Procedure Laterality Date   COLONOSCOPY WITH ESOPHAGOGASTRODUODENOSCOPY (EGD)  2020   CYSTOSCOPY/RETROGRADE/URETEROSCOPY Left 03/24/2022   Procedure: CYSTOSCOPY/RETROGRADE/URETEROSCOPY;  Surgeon: Billey Co, MD;  Location: [facility] ORS;  Service: Urology;  Laterality: Left;   GASTRIC BLEED REPAIR  2008   IR RADIOLOGIST EVAL & MGMT  09/07/2022   KNEE ARTHROSCOPY Left 2002   meniscus   RADIOLOGY WITH ANESTHESIA Left 09/27/2022   Procedure: CT WITH ANESTHESIA GUIDED ABLATION;  Surgeon: Michaelle Birks, MD;  Location: [facility] ORS;  Service: Radiology;  Laterality: Left;   ROUX-EN-Y GASTRIC BYPASS  07/2004   URETEROSCOPY Left 03/24/2022   Procedure: DIAGNOSTIC  URETEROSCOPY;  Surgeon: Billey Co, MD;  Location: [facility] ORS;  Service: Urology;  Laterality: Left;    Social History   Tobacco Use   Smoking status: Never    Passive exposure: Never   Smokeless tobacco: Never  Vaping Use   Vaping Use: Never used  Substance Use Topics   Alcohol use: Never   Drug use: Never     Medication list has been reviewed and updated.  Current Meds  Medication Sig   Acetaminophen 167 MG/5ML LIQD Take 30 mLs by mouth 3 (three) times daily as needed (pain).   Blood Pressure Monitoring KIT 1 each by Does not apply route daily.   CALCIUM CITRATE PO Take 600 mg by mouth daily.   cetirizine (ZYRTEC) 10 MG tablet Take 10 mg by mouth daily.   Iron, Ferrous Sulfate, 325 (65 Fe) MG TABS Take 325 mg by mouth 2 (two) times a week. Monday & Thursday   Menthol, Topical Analgesic, (ICY HOT PAIN RELIEVING EX) Apply 1 patch topically daily as needed (pain).   Menthol, Topical Analgesic, (ICY HOT) 5 % PTCH Apply 1 patch topically daily as needed (pain).   Multiple Vitamins-Minerals (CENTRUM SILVER PO) Take 1 tablet by mouth daily.   Omeprazole 20 MG TBEC Take 20 mg by mouth daily.   vitamin B-12 (CYANOCOBALAMIN) 1000 MCG tablet Take 1,000 mcg by mouth daily.   [DISCONTINUED] lisinopril (ZESTRIL) 20 MG tablet Take 1 tablet (20 mg total) by mouth daily. (Patient taking differently: Take 5 mg by mouth daily.)       10/25/2022    8:18 AM 08/21/2022    1:28 PM 08/14/2022    4:08 PM 06/16/2022    2:07 PM  GAD 7 : Generalized Anxiety Score  Nervous, Anxious, on Edge 1 0 1 0  Control/stop worrying 1 0 0 0  Worry too much - different things 0 0 0 0  Trouble relaxing 1 0 0 0  Restless 1 0 0 0  Easily annoyed or irritable 0 1 0 0  Afraid - awful might happen 0 1 0 0  Total GAD 7 Score '4 2 1 '$ 0  Anxiety Difficulty Not difficult at all Somewhat difficult Not difficult at all Not difficult at all       10/25/2022    8:18 AM 08/21/2022    1:28 PM 08/14/2022    4:07 PM   Depression screen PHQ 2/9  Decreased Interest 0 0 0  Down, Depressed, Hopeless 0 0 0  PHQ - 2 Score 0 0 0  Altered sleeping 0 3 1  Tired, decreased energy '1 1 1  '$ Change in appetite 0 0 0  Feeling bad or failure about yourself  0 0 0  Trouble concentrating 0 0 0  Moving slowly or fidgety/restless 0 0 0  Suicidal thoughts 0 0 0  PHQ-9 Score '1 4 2  '$ Difficult doing work/chores Not difficult at all Not difficult at all Not difficult at all    BP Readings from Last 3 Encounters:  10/25/22 122/86  09/28/22 111/71  09/07/22 132/89    Physical Exam Vitals and nursing note reviewed.  Constitutional:  Appearance: Normal appearance. He is well-developed.  HENT:     Head: Normocephalic.     Right Ear: Tympanic membrane, ear canal and external ear normal.     Left Ear: Tympanic membrane, ear canal and external ear normal.     Nose: Nose normal.  Eyes:     Conjunctiva/sclera: Conjunctivae normal.     Pupils: Pupils are equal, round, and reactive to light.  Neck:     Thyroid: No thyromegaly.     Vascular: No carotid bruit.  Cardiovascular:     Rate and Rhythm: Normal rate and regular rhythm.     Heart sounds: Normal heart sounds.  Pulmonary:     Effort: Pulmonary effort is normal.     Breath sounds: Normal breath sounds. No wheezing.  Chest:  Breasts:    Right: No mass.     Left: No mass.  Abdominal:     General: Bowel sounds are normal.     Palpations: Abdomen is soft.     Tenderness: There is no abdominal tenderness.  Musculoskeletal:        General: Normal range of motion.     Cervical back: Normal range of motion and neck supple.     Right lower leg: No edema.     Left lower leg: No edema.  Lymphadenopathy:     Cervical: No cervical adenopathy.  Skin:    General: Skin is warm and dry.     Capillary Refill: Capillary refill takes less than 2 seconds.  Neurological:     General: No focal deficit present.     Mental Status: He is alert and oriented to person,  place, and time.     Deep Tendon Reflexes: Reflexes are normal and symmetric.  Psychiatric:        Attention and Perception: Attention normal.        Mood and Affect: Mood normal.        Thought Content: Thought content normal.     Wt Readings from Last 3 Encounters:  10/25/22 287 lb (130.2 kg)  09/26/22 280 lb (127 kg)  09/05/22 277 lb (125.6 kg)    BP 122/86 (BP Location: Right Arm, Cuff Size: Large)   Pulse 73   Ht '5\' 11"'$  (1.803 m)   Wt 287 lb (130.2 kg)   SpO2 98%   BMI 40.03 kg/m   Assessment and Plan: Problem List Items Addressed This Visit       Cardiovascular and Mediastinum   Essential hypertension (Chronic)    Clinically stable exam with fairly controlled BP on lisinopril 5 mg. Tolerating medications without side effects. Recommend increasing to 10 mg for a goal < 130/80       Relevant Medications   lisinopril (ZESTRIL) 10 MG tablet     Genitourinary   Papillary renal cell carcinoma (HCC)    Recent cryoablation BMP normal  GFR >60 12/023      Relevant Orders   Comprehensive metabolic panel   Other Visit Diagnoses     Annual physical exam    -  Primary   normal exam except for weight continue exercise, dietary changes consider Shingrix vaccines   Relevant Orders   Comprehensive metabolic panel   Lipid panel   PSA   Prostate cancer screening       DRE deferred   Relevant Orders   PSA   Screening for lipid disorders       Relevant Orders   Lipid panel  Partially dictated using Editor, commissioning. Any errors are unintentional.  Halina Maidens, MD [facility]  10/25/2022

## 2022-10-25 NOTE — Assessment & Plan Note (Signed)
Recent cryoablation BMP normal  GFR >60 12/023

## 2022-10-26 LAB — COMPREHENSIVE METABOLIC PANEL
ALT: 17 IU/L (ref 0–44)
AST: 23 IU/L (ref 0–40)
Albumin/Globulin Ratio: 2 (ref 1.2–2.2)
Albumin: 4.7 g/dL (ref 3.9–4.9)
Alkaline Phosphatase: 91 IU/L (ref 44–121)
BUN/Creatinine Ratio: 10 (ref 10–24)
BUN: 11 mg/dL (ref 8–27)
Bilirubin Total: 0.5 mg/dL (ref 0.0–1.2)
CO2: 23 mmol/L (ref 20–29)
Calcium: 9.6 mg/dL (ref 8.6–10.2)
Chloride: 103 mmol/L (ref 96–106)
Creatinine, Ser: 1.08 mg/dL (ref 0.76–1.27)
Globulin, Total: 2.3 g/dL (ref 1.5–4.5)
Glucose: 102 mg/dL — ABNORMAL HIGH (ref 70–99)
Potassium: 4.7 mmol/L (ref 3.5–5.2)
Sodium: 141 mmol/L (ref 134–144)
Total Protein: 7 g/dL (ref 6.0–8.5)
eGFR: 78 mL/min/{1.73_m2} (ref >59)

## 2022-10-26 LAB — LIPID PANEL
Chol/HDL Ratio: 3.8 ratio (ref 0.0–5.0)
Cholesterol, Total: 183 mg/dL (ref 100–199)
HDL: 48 mg/dL (ref >39)
LDL Chol Calc (NIH): 109 mg/dL — ABNORMAL HIGH (ref 0–99)
Triglycerides: 148 mg/dL (ref 0–149)
VLDL Cholesterol Cal: 26 mg/dL (ref 5–40)

## 2022-10-26 LAB — PSA: Prostate Specific Ag, Serum: 1 ng/mL (ref 0.0–4.0)

## 2022-10-26 NOTE — Progress Notes (Signed)
PEC may give results if patient returns call - CRM created.  KP

## 2022-10-30 NOTE — Progress Notes (Incomplete)
Reason for visit: The patient is seen in follow up today s/p L renal mass Bx and ablation   Care Team(s): PCP: [name], MD Urology: Billey Co, MD  Virtual Visit via Telephone Note   I connected with at 9:00 AM EST by telephone and verified that I am speaking with the correct person using two identifiers. I discussed the limitations, risks, security and privacy concerns of performing an evaluation and management service by telephone and the availability of in-person appointments. They are joined in the visit by his Wife, April.  History of present illness:  62 y/o M comorbid including morbid obesity w prior RNYGB and HTN. Pt is seen in 1 month follow up post L renal mass Bx and cryoablation. He was in active surveillance by his Urologist, Dr. [name], with mass enlargement on imaging. Secondary to comorbidities, minimally invasive therapy was recommended. He underwent successful cryoablation by me on 09/27/22 and was discharged without event after overnight observation.  He denies any discomfort, hematuria or any localizing symptoms.  Review of Systems: A 12-point ROS discussed, and pertinent positives are indicated in the HPI above.  All other systems are otherwise negative.   Past Medical History:  Diagnosis Date   Arthritis    Barrett's esophagus    GERD (gastroesophageal reflux disease)    Headache    History of kidney stones    Hydronephrosis, left 03/2022   Hypertension    Left renal mass    Microcytic anemia    Morbid obesity ([place])     Past Surgical History:  Procedure Laterality Date   COLONOSCOPY WITH ESOPHAGOGASTRODUODENOSCOPY (EGD)  2020   CYSTOSCOPY/RETROGRADE/URETEROSCOPY Left 03/24/2022   Procedure: CYSTOSCOPY/RETROGRADE/URETEROSCOPY;  Surgeon: Billey Co, MD;  Location: [facility];  Service: Urology;  Laterality: Left;   GASTRIC BLEED REPAIR  2008   IR RADIOLOGIST EVAL & MGMT  09/07/2022   KNEE ARTHROSCOPY Left 2002   meniscus    RADIOLOGY WITH ANESTHESIA Left 09/27/2022   Procedure: CT WITH ANESTHESIA GUIDED ABLATION;  Surgeon: Michaelle Birks, MD;  Location: [facility];  Service: Radiology;  Laterality: Left;   ROUX-EN-Y GASTRIC BYPASS  07/2004   URETEROSCOPY Left 03/24/2022   Procedure: DIAGNOSTIC URETEROSCOPY;  Surgeon: Billey Co, MD;  Location: [facility];  Service: Urology;  Laterality: Left;    Allergies: Aspirin and Nsaids  Medications: Prior to Admission medications   Medication Sig Start Date End Date Taking? Authorizing Provider  Acetaminophen 167 MG/5ML LIQD Take 30 mLs by mouth 3 (three) times daily as needed (pain).    [provider]  Blood Pressure Monitoring KIT 1 each by Does not apply route daily. 08/04/22   [name], MD  CALCIUM CITRATE PO Take 600 mg by mouth daily.    [provider]  cetirizine (ZYRTEC) 10 MG tablet Take 10 mg by mouth daily.    [provider]  Iron, Ferrous Sulfate, 325 (65 Fe) MG TABS Take 325 mg by mouth 2 (two) times a week. Monday & Thursday    [provider]  lisinopril (ZESTRIL) 10 MG tablet Take 1 tablet (10 mg total) by mouth daily. 10/25/22 04/23/23  [name], MD  Menthol, Topical Analgesic, (ICY HOT PAIN RELIEVING EX) Apply 1 patch topically daily as needed (pain).    [provider]  Menthol, Topical Analgesic, (ICY HOT) 5 % PTCH Apply 1 patch topically daily as needed (pain).    [provider]  Multiple Vitamins-Minerals (CENTRUM SILVER PO) Take  1 tablet by mouth daily.    [provider]  Omeprazole 20 MG TBEC Take 20 mg by mouth daily.    [provider]  vitamin B-12 (CYANOCOBALAMIN) 1000 MCG tablet Take 1,000 mcg by mouth daily.    [provider]     No family history on file.  Social History   Socioeconomic History   Marital status: Married    Spouse name: April   Number of children: 2   Years of education: Not on file   Highest education level: Not on  file  Occupational History   Not on file  Tobacco Use   Smoking status: Never    Passive exposure: Never   Smokeless tobacco: Never  Vaping Use   Vaping Use: Never used  Substance and Sexual Activity   Alcohol use: Never   Drug use: Never   Sexual activity: Yes    Birth control/protection: None  Other Topics Concern   Not on file  Social History Narrative   Not on file   Social Determinants of Health   Financial Resource Strain: Not on file  Food Insecurity: No Food Insecurity (09/27/2022)   Hunger Vital Sign    Worried About Running Out of Food in the Last Year: Never true    Ran Out of Food in the Last Year: Never true  Transportation Needs: No Transportation Needs (09/27/2022)   PRAPARE - Hydrologist (Medical): No    Lack of Transportation (Non-Medical): No  Physical Activity: Not on file  Stress: Not on file  Social Connections: Not on file    Vital Signs: There were no vitals taken for this visit.  Physical Exam Deferred secondary to virtual visit.  Imaging: IR L renal mass cryoablation: 09/27/22 Adequate L inferior renal pole mass targeting and ablation.    Labs:  CBC: Recent Labs    01/29/22 2055 09/27/22 1035  WBC 9.7 4.9  HGB 15.5 13.1  HCT 44.8 38.3*  PLT 290 246    COAGS: Recent Labs    09/27/22 1035  INR 1.2    BMP: Recent Labs    01/29/22 2055 08/30/22 0932 09/27/22 1035 10/25/22 0837  NA 134*  --  139 141  K 4.0  --  3.9 4.7  CL 103  --  109 103  CO2 21*  --  23 23  GLUCOSE 106*  --  101* 102*  BUN 17  --  14 11  CALCIUM 9.7  --  9.2 9.6  CREATININE 1.22 1.50* 1.02 1.08  GFRNONAA >60  --  >60  --    Pathology:  SURGICAL PATHOLOGY  CASE: [id number]  PATIENT: Colton Butler  Surgical Pathology Report   Clinical History: None, (adc)    FINAL MICROSCOPIC DIAGNOSIS:   A. LEFT RENAL MASS, CORE BIOPSY:  Papillary renal cell carcinoma, type I    Assessment and Plan:  62 y/o M  comorbid w PMHx significant for morbid obesity w prior RNYGB and HTN referred for enlarging, 2.1 cm L renal mass. Pt now s/p L renal mass biopsy and cryoablation on 09/27/22   Pathology positive for papillary RCC  *Pt back to baseline. No post procedural concern. *3 mos follow up imaging w multiphasic CT post ablation (09/27/22), no sooner. *Will see him back, in virtual clinic, for imaging review in 3 mos. *Pt will also follow up with his Urologist, Dr. Nickolas Madrid     Thank you for allowing [place] to participate in the  care of this Patient. Please contact me with questions, concerns, or if new issues arise.   Electronically Signed:  Michaelle Birks, MD [facility] Radiology   Pager. [phone] Clinic. [phone]  I spent a total of 30 Minutes of non-face-to-face time in clinical consultation, greater than 50% of which was counseling/coordinating care for Mr. Colton Butler evaluation for L renal mass.

## 2022-10-31 ENCOUNTER — Ambulatory Visit
Admission: RE | Admit: 2022-10-31 | Discharge: 2022-10-31 | Disposition: A | Discharge status: Home / Self Care | Payer: 59 | Source: Ambulatory Visit | Attending: Radiology | Admitting: Radiology

## 2022-10-31 DIAGNOSIS — N2889 Other specified disorders of kidney and ureter: Secondary | ICD-10-CM

## 2022-10-31 HISTORY — PX: IR RADIOLOGIST EVAL & MGMT: IMG5224

## 2022-11-02 ENCOUNTER — Telehealth: Payer: Self-pay | Admitting: Internal Medicine

## 2022-11-02 NOTE — Telephone Encounter (Signed)
Copied from [place] [id number]. Topic: Complaint - Billing/Coding >> Nov 02, 2022  8:06 AM Sabas Sous wrote: DOS: 10/25/2022 Details of complaint: Pt has received a bill of 182.40 (the total bill is 480 discounted for uninsured) How would the patient like to see this issue resolved?   Pt states that he still has [place] [facility] however his bill reflects that he was filed as an uninsured patient.   Please call back at [phone] for status updates  Route to Engineer, building services.

## 2022-12-05 ENCOUNTER — Other Ambulatory Visit: Payer: Self-pay | Admitting: Interventional Radiology

## 2022-12-05 DIAGNOSIS — N2889 Other specified disorders of kidney and ureter: Secondary | ICD-10-CM

## 2022-12-27 ENCOUNTER — Ambulatory Visit
Admission: RE | Admit: 2022-12-27 | Discharge: 2022-12-27 | Disposition: A | Discharge status: Home / Self Care | Payer: 59 | Source: Ambulatory Visit | Attending: Interventional Radiology | Admitting: Interventional Radiology

## 2022-12-27 DIAGNOSIS — C642 Malignant neoplasm of left kidney, except renal pelvis: Secondary | ICD-10-CM | POA: Diagnosis not present

## 2022-12-27 DIAGNOSIS — N2889 Other specified disorders of kidney and ureter: Secondary | ICD-10-CM | POA: Insufficient documentation

## 2022-12-27 LAB — POCT I-STAT CREATININE: Creatinine, Ser: 1.3 mg/dL — ABNORMAL HIGH (ref 0.61–1.24)

## 2022-12-27 MED ORDER — IOHEXOL 300 MG/ML  SOLN
100.0000 mL | Freq: Once | INTRAMUSCULAR | Status: AC | PRN
  Administered 2022-12-27: 100 mL via INTRAVENOUS

## 2023-01-01 ENCOUNTER — Ambulatory Visit
Admission: RE | Admit: 2023-01-01 | Discharge: 2023-01-01 | Disposition: A | Discharge status: Home / Self Care | Payer: 59 | Source: Ambulatory Visit | Attending: Interventional Radiology | Admitting: Interventional Radiology

## 2023-01-01 DIAGNOSIS — N2889 Other specified disorders of kidney and ureter: Secondary | ICD-10-CM

## 2023-01-01 DIAGNOSIS — C642 Malignant neoplasm of left kidney, except renal pelvis: Secondary | ICD-10-CM | POA: Diagnosis not present

## 2023-01-01 HISTORY — PX: IR RADIOLOGIST EVAL & MGMT: IMG5224

## 2023-01-01 NOTE — Progress Notes (Signed)
Reason for visit: The patient is seen in for 3 mos imaging review s/p L renal mass Bx and ablation   Care Team(s): PCP: Colton Hess, MD Urology: Colton Co, MD  History of present illness:  62 y/o M comorbid including morbid obesity w prior RNYGB and HTN. Pt represents for 3 month post ablation imaging review post L renal mass Bx and cryoablation. Pt had been followed with active surveillance by his Urologist, Dr. Diamantina Butler, with noted mass enlargement on imaging. Secondary to comorbidities, minimally invasive therapy was recommended. He underwent successful cryoablation by me on 09/27/22.   Pt was last "seen" in immediate / 1 month post procedural follow up and was already back to baseline, bar some intermittent "burn" at his L side. He is joined by his spouse, Colton Butler, and they both report that he is feeling much better. He denies any discomfort, hematuria or other localizing symptoms.   Review of Systems: A 12-point ROS discussed, and pertinent positives are indicated in the HPI above.  All other systems are otherwise negative.  Past Medical History:  Diagnosis Date   Arthritis    Barrett's esophagus    GERD (gastroesophageal reflux disease)    Headache    History of kidney stones    Hydronephrosis, left 03/2022   Hypertension    Left renal mass    Microcytic anemia    Morbid obesity     Past Surgical History:  Procedure Laterality Date   COLONOSCOPY WITH ESOPHAGOGASTRODUODENOSCOPY (EGD)  2020   CYSTOSCOPY/RETROGRADE/URETEROSCOPY Left 03/24/2022   Procedure: CYSTOSCOPY/RETROGRADE/URETEROSCOPY;  Surgeon: Colton Co, MD;  Location: [facility];  Service: Urology;  Laterality: Left;   GASTRIC BLEED REPAIR  2008   IR RADIOLOGIST EVAL & MGMT  09/07/2022   IR RADIOLOGIST EVAL & MGMT  10/31/2022   IR RADIOLOGIST EVAL & MGMT  01/01/2023   KNEE ARTHROSCOPY Left 2002   meniscus   RADIOLOGY WITH ANESTHESIA Left 09/27/2022   Procedure: CT WITH ANESTHESIA GUIDED ABLATION;   Surgeon: Colton Birks, MD;  Location: [facility];  Service: Radiology;  Laterality: Left;   ROUX-EN-Y GASTRIC BYPASS  07/2004   URETEROSCOPY Left 03/24/2022   Procedure: DIAGNOSTIC URETEROSCOPY;  Surgeon: Colton Co, MD;  Location: [facility];  Service: Urology;  Laterality: Left;    Allergies: Aspirin and Nsaids  Medications: Prior to Admission medications   Medication Sig Start Date End Date Taking? Authorizing Provider  Acetaminophen 167 MG/5ML LIQD Take 30 mLs by mouth 3 (three) times daily as needed (pain).    [provider]  Blood Pressure Monitoring KIT 1 each by Does not apply route daily. 08/04/22   Colton Hess, MD  CALCIUM CITRATE PO Take 600 mg by mouth daily.    [provider]  cetirizine (ZYRTEC) 10 MG tablet Take 10 mg by mouth daily.    [provider]  Iron, Ferrous Sulfate, 325 (65 Fe) MG TABS Take 325 mg by mouth 2 (two) times a week. Monday & Thursday    [provider]  lisinopril (ZESTRIL) 10 MG tablet Take 1 tablet (10 mg total) by mouth daily. 10/25/22 04/23/23  Colton Hess, MD  Menthol, Topical Analgesic, (ICY HOT PAIN RELIEVING EX) Apply 1 patch topically daily as needed (pain).    [provider]  Menthol, Topical Analgesic, (ICY HOT) 5 % PTCH Apply 1 patch topically daily as needed (pain).    [provider]  Multiple Vitamins-Minerals (CENTRUM SILVER PO) Take 1 tablet by mouth  daily.    [provider]  Omeprazole 20 MG TBEC Take 20 mg by mouth daily.    [provider]  vitamin B-12 (CYANOCOBALAMIN) 1000 MCG tablet Take 1,000 mcg by mouth daily.    [provider]     No family history on file.  Social History   Socioeconomic History   Marital status: Married    Spouse name: Colton Butler   Number of children: 2   Years of education: Not on file   Highest education level: Not on file  Occupational History   Not on file  Tobacco Use   Smoking status: Never    Passive  exposure: Never   Smokeless tobacco: Never  Vaping Use   Vaping Use: Never used  Substance and Sexual Activity   Alcohol use: Never   Drug use: Never   Sexual activity: Yes    Birth control/protection: None  Other Topics Concern   Not on file  Social History Narrative   Not on file   Social Determinants of Health   Financial Resource Strain: Not on file  Food Insecurity: No Food Insecurity (09/27/2022)   Hunger Vital Sign    Worried About Running Out of Food in the Last Year: Never true    Ran Out of Food in the Last Year: Never true  Transportation Needs: No Transportation Needs (09/27/2022)   PRAPARE - Hydrologist (Medical): No    Lack of Transportation (Non-Medical): No  Physical Activity: Not on file  Stress: Not on file  Social Connections: Not on file    Vital Signs: BP (!) 145/96 (BP Location: Left Arm, Patient Position: Sitting, Cuff Size: Normal)   Pulse 76   Temp 98.1 F (36.7 C) (Oral)   Wt 131.5 kg   SpO2 96% Comment: room air  BMI 40.45 kg/m   Physical Exam  General: Obese, NAD  CV: RRR on monitor Pulm: normal work of breathing on RA Abd: S, NT, rotund MSK: Grossly normal though uses walking cane for support Psych: Appropriate affect.  Imaging:  CT AP multiphase, 12/27/22 Post ablation changes of the LEFT lower pole. No appreciable residual enhancement to suggest residual/recurrent disease.     Labs:  CBC: Recent Labs    01/29/22 2055 09/27/22 1035  WBC 9.7 4.9  HGB 15.5 13.1  HCT 44.8 38.3*  PLT 290 246    BMP: Recent Labs    01/29/22 2055 08/30/22 0932 09/27/22 1035 10/25/22 0837 12/27/22 0859  NA 134*  --  139 141  --   K 4.0  --  3.9 4.7  --   CL 103  --  109 103  --   CO2 21*  --  23 23  --   GLUCOSE 106*  --  101* 102*  --   BUN 17  --  14 11  --   CALCIUM 9.7  --  9.2 9.6  --   CREATININE 1.22 1.50* 1.02 1.08 1.30*  GFRNONAA >60  --  >60  --   --     Pathology:   SURGICAL  PATHOLOGY  CASE: [id number]  PATIENT: Colton Butler  Surgical Pathology Report    Clinical History: None, (adc)    FINAL MICROSCOPIC DIAGNOSIS:    A. LEFT RENAL MASS, CORE BIOPSY:  Papillary renal cell carcinoma, type I    Assessment and Plan:  62 y/o M comorbid w PMHx significant for morbid obesity w prior RNYGB and HTN referred for enlarging, 2.1  cm L renal mass. Pt now s/p L renal mass biopsy and cryoablation on 09/27/22   Pathology positive for papillary RCC 3 mos post ablation CT without evidence of residual or recurrent disease.   *6 mos imaging interval / 9 mos post ablation (09/27/22) follow up imaging w multiphasic CT. *Will see him back in clinic for imaging review. *Pt will also follow up with his Urologist, Dr. Diamantina Butler *Comorbidity follow up with PCP, Dr. Army Melia    Thank you for allowing [place] to participate in the care of this Patient.  Electronically Signed:   Michaelle Birks, MD [facility] Radiology    I spent a total of 25 Minutes in face to face in clinical consultation, greater than 50% of which was counseling/coordinating care for Mr Colton Butler renal cancer follow up

## 2023-03-13 ENCOUNTER — Encounter: Payer: Self-pay | Admitting: Urology

## 2023-03-13 ENCOUNTER — Ambulatory Visit: Payer: 59 | Admitting: Urology

## 2023-03-13 VITALS — BP 133/87 | HR 87 | Ht 71.0 in | Wt 295.4 lb

## 2023-03-13 DIAGNOSIS — R3912 Poor urinary stream: Secondary | ICD-10-CM | POA: Diagnosis not present

## 2023-03-13 DIAGNOSIS — C642 Malignant neoplasm of left kidney, except renal pelvis: Secondary | ICD-10-CM

## 2023-03-13 DIAGNOSIS — Z85528 Personal history of other malignant neoplasm of kidney: Secondary | ICD-10-CM | POA: Diagnosis not present

## 2023-03-13 DIAGNOSIS — R350 Frequency of micturition: Secondary | ICD-10-CM

## 2023-03-13 DIAGNOSIS — N138 Other obstructive and reflux uropathy: Secondary | ICD-10-CM

## 2023-03-13 DIAGNOSIS — Z08 Encounter for follow-up examination after completed treatment for malignant neoplasm: Secondary | ICD-10-CM | POA: Diagnosis not present

## 2023-03-13 NOTE — Patient Instructions (Signed)
The Benefits of a Plant-Based Diet for Urology Health  A plant-based diet emphasizes the consumption of whole, unprocessed plant foods while minimizing or excluding animal products including meat and dairy products. This dietary approach has gained attention for its potential to promote overall health, including urology-related conditions. Incorporating a plant-based diet into your lifestyle can offer numerous benefits for maintaining optimal urology health.  1. Reduced Risk of Kidney Stones: A plant-based diet is typically rich in fruits, vegetables, legumes, and whole grains. These foods are high in dietary fiber, potassium, and magnesium, which can help reduce the risk of developing kidney stones. Be careful to avoid high quantities of spinach, as these can contribute to kidney stone formation if eaten in large volumes. The increased intake of water-soluble fiber can enhance the excretion of waste products and prevent the crystallization of minerals that lead to stone formation.  2. Improved Prostate Health: Studies have suggested a link between the consumption of red and processed meats and an increased risk of prostate problems, including benign prostatic hyperplasia (BPH) and prostate cancer. By adopting a plant-based diet, you can lower your intake of saturated fats and decrease the risk of these conditions. PSA levels can often decrease on plant based diets! Plant foods are also rich in antioxidants and phytochemicals that have been associated with prostate health.  3. Better Bladder Function: A diet focused on plant-based foods can contribute to better bladder health by reducing the risk of urinary tract infections (UTIs). Berries, citrus fruits, and leafy greens are known for their high vitamin C content, which can acidify urine and create an environment less favorable for bacteria growth. Additionally, plant-based diets are generally lower in sodium, which can help prevent fluid retention and  reduce the strain on the bladder.  4. Management of Erectile Dysfunction (ED): Some research suggests that a plant-based diet can positively impact erectile function. Plant-based diets are associated with improved cardiovascular health, which is crucial for maintaining healthy blood flow and nerve function required for proper erectile function. By reducing the consumption of high-cholesterol and high-saturated fat animal products, a plant-based diet may contribute to a decreased risk of ED.  5. Prevention of Chronic Conditions: A plant-based diet can help prevent or manage chronic conditions such as obesity, diabetes, and hypertension. These conditions can contribute to urology-related issues, including urinary incontinence and kidney dysfunction. By maintaining a healthy weight and managing these conditions, you can reduce the risk of urology-related complications.  Conclusion: Embracing a plant-based diet can offer significant benefits for urology health. By incorporating a variety of colorful fruits, vegetables, whole grains, nuts, seeds, and legumes into your meals, you can support kidney health, prostate health, bladder function, and overall well-being. Remember to consult with a healthcare professional or registered dietitian before making any significant dietary changes, especially if you have existing health conditions. Your personalized approach to a plant-based diet can contribute to improved urology health and enhance your quality of life.      

## 2023-03-13 NOTE — Progress Notes (Signed)
   03/13/2023 10:26 AM   Lianne Bushy 1961-07-28 [id number]  Reason for visit: Follow up papillary RCC, history of left hydronephrosis, new urinary symptoms  HPI: 62 year old male with morbid obesity and BMI of 41 who originally presented to the ER in April 2023 with severe left-sided flank pain and moderate left-sided hydronephrosis with no stones or etiology of his flank pain.  Follow-up ultrasound showed persistent left hydronephrosis and he underwent a cystoscopy, left retrograde pyelogram, left distal ureteral balloon dilation and stent placement for some subtle narrowing of the left distal ureter.  He has done well since that time with no significant left-sided flank pain, no hydronephrosis on follow-up imaging.  He also underwent percutaneous biopsy and ablation of a 2 cm left renal mass in December 2023.  This showed papillary RCC type I.  He follows with radiology for surveillance, and I personally viewed and interpreted the CT from March 2024 showing no evidence of recurrence or enhancement of the prior renal mass, and no hydronephrosis.  He is scheduled for them in September 2024 for his next surveillance imaging.  Overall he really denies any complaints today.  No left-sided flank pain.  He has some mild weak urinary stream and frequency, nocturia 1-2 times overnight.  This is minimally bothersome.  We discussed options including a trial of medications like Flomax but he deferred.  Behavioral and diet strategies were discussed.  Continue follow-up with radiology for history of papillary RCC treated with ablation RTC 1 year PVR, sooner if problems   Sondra Come, MD  [facility] [address] [place], [place] [address] [phone]

## 2023-04-20 IMAGING — CT CT ABD-PELV W/O CM
2 of 4 series · 15 of 46 positions shown, 17 images · non-contrast
Comparison: None.

CLINICAL DATA: Left flank pain, suprapubic pain



[Series 2: ap without · axial · non-contrast · 0.98mm/px · z∈[-1401,-891]mm · 12 of 116 slices shown, 14 images]
[im 7/116  soft-tissue]
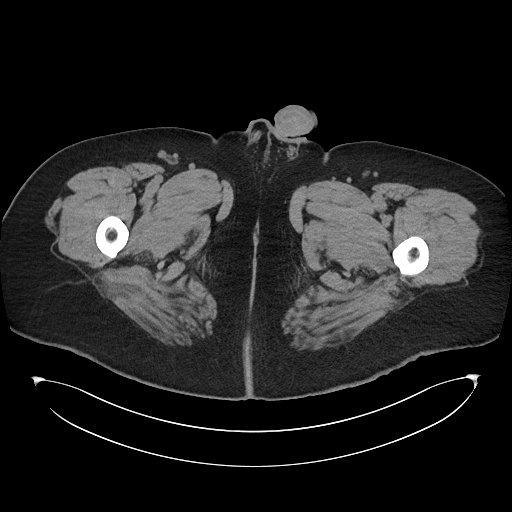
[im 7/116  bone]
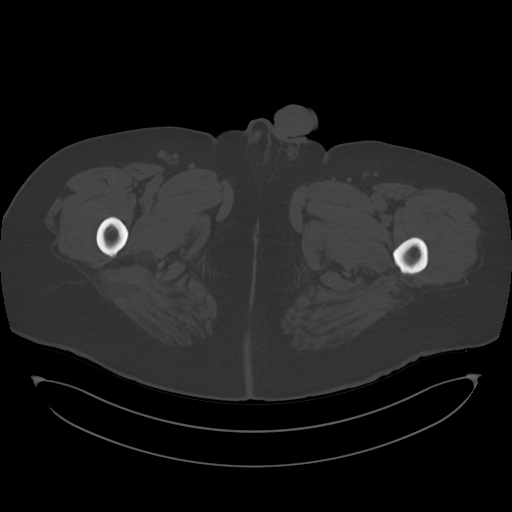
[im 19/116  soft-tissue]
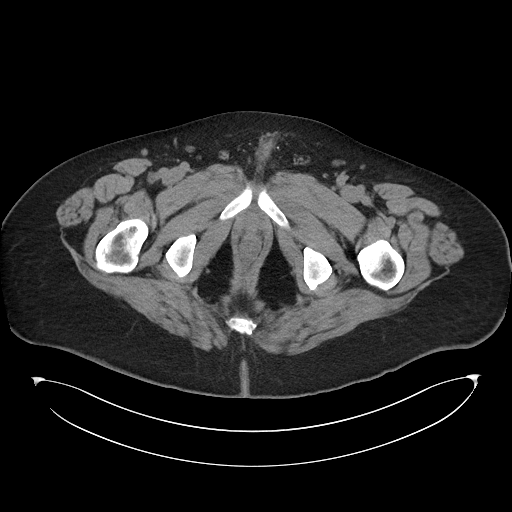
[im 25/116  soft-tissue]
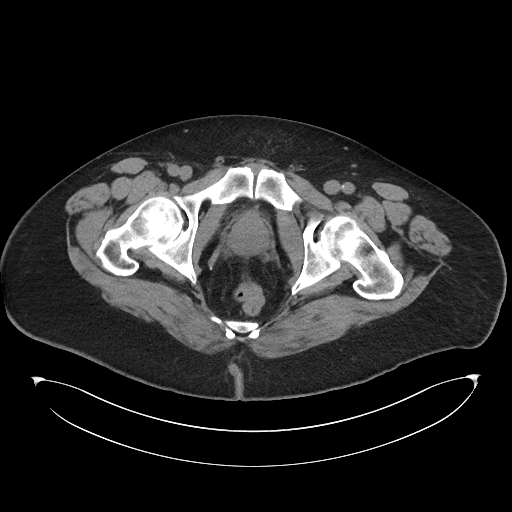
[im 37/116  soft-tissue]
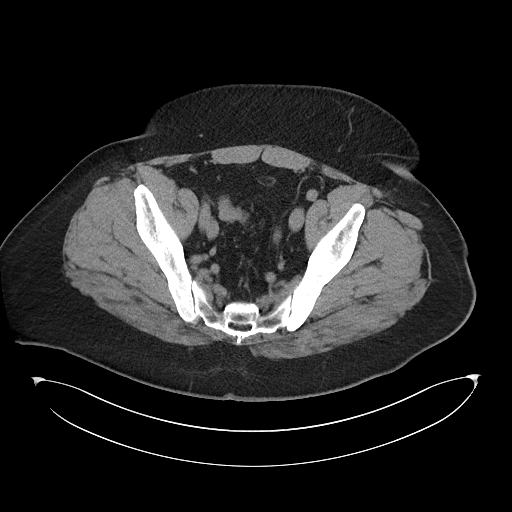
[im 43/116  soft-tissue]
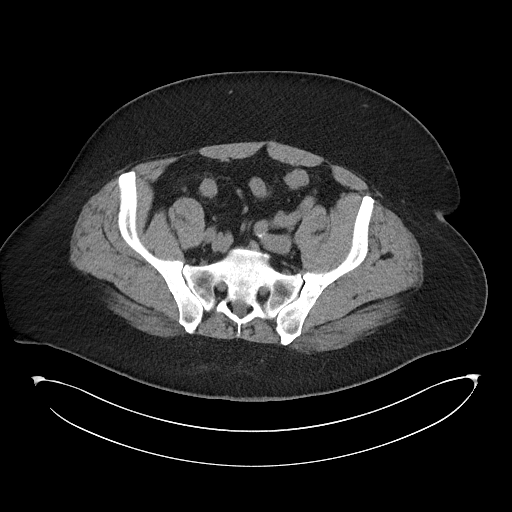
[im 55/116  soft-tissue]
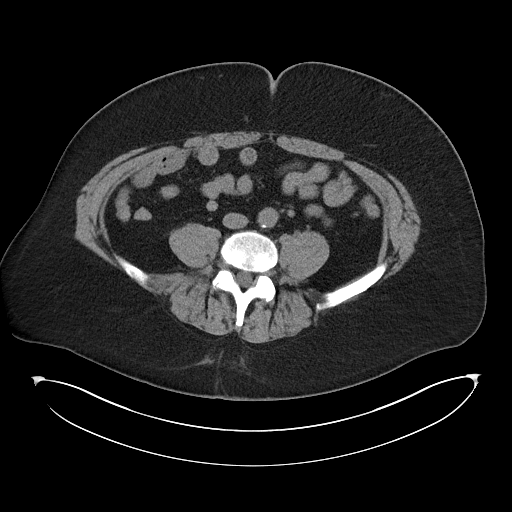
[im 61/116  soft-tissue]
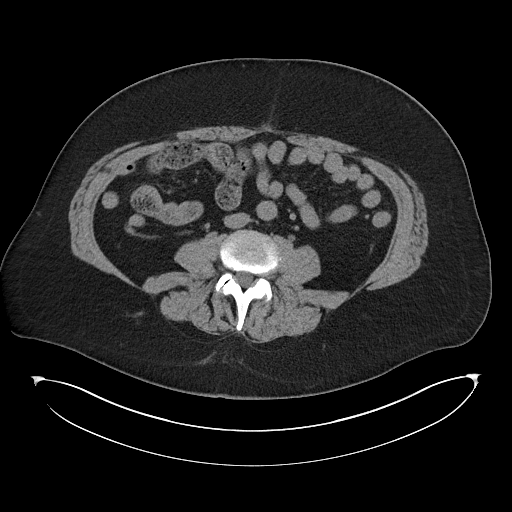
[im 73/116  soft-tissue]
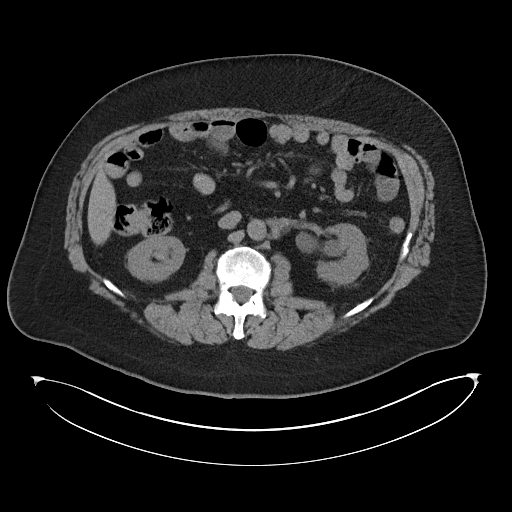
[im 79/116  soft-tissue]
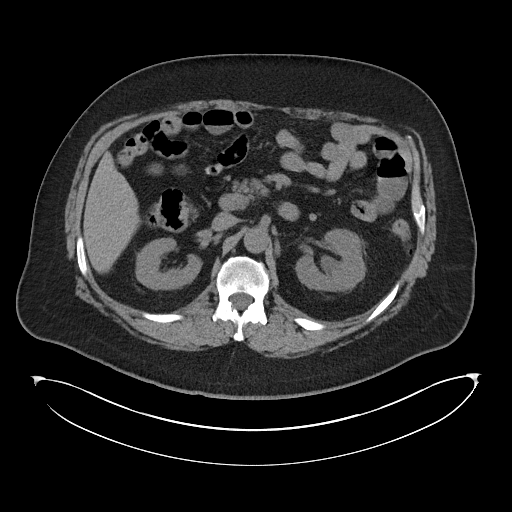
[im 79/116  bone]
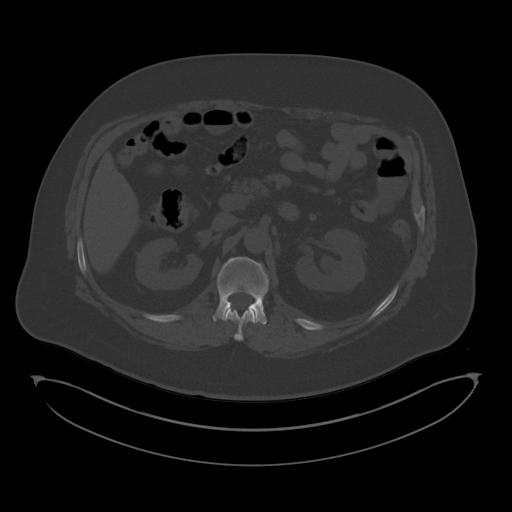
[im 91/116  soft-tissue]
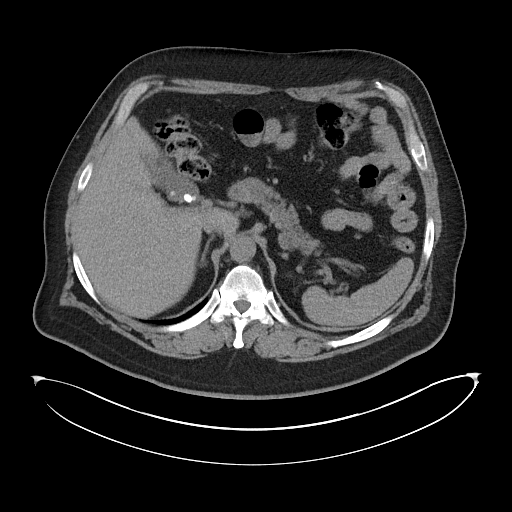
[im 97/116  soft-tissue]
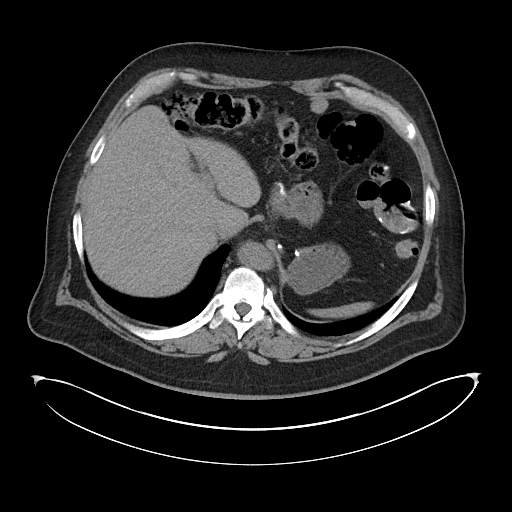
[im 109/116  soft-tissue]
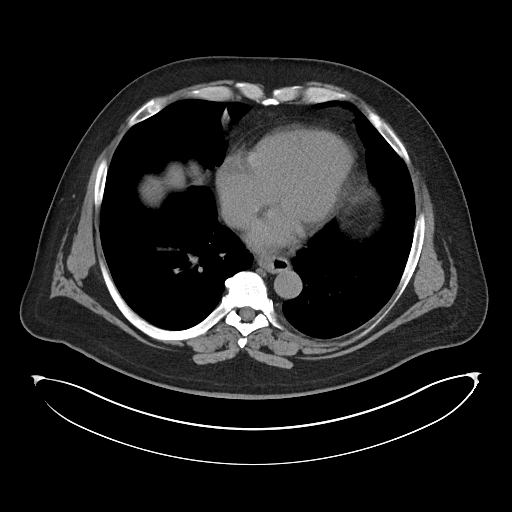

[Series 5: cor · coronal · 0.97mm/px · 3 of 102 slices shown]
[im 34/102  soft-tissue]
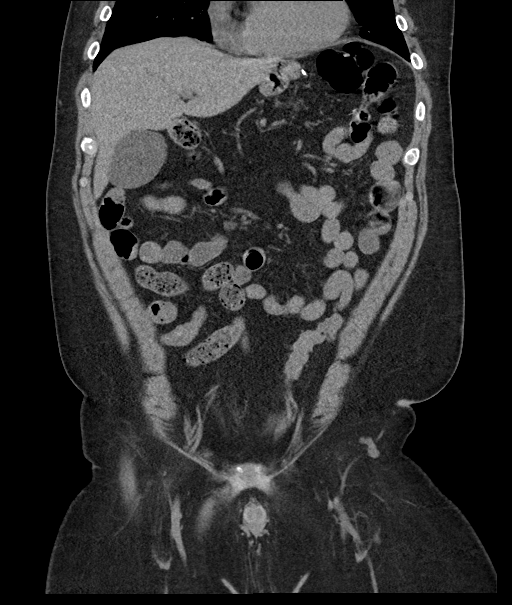
[im 45/102  soft-tissue]
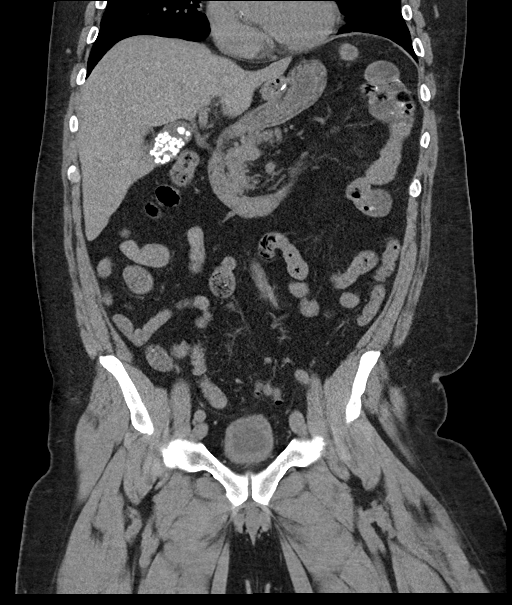
[im 57/102  soft-tissue]
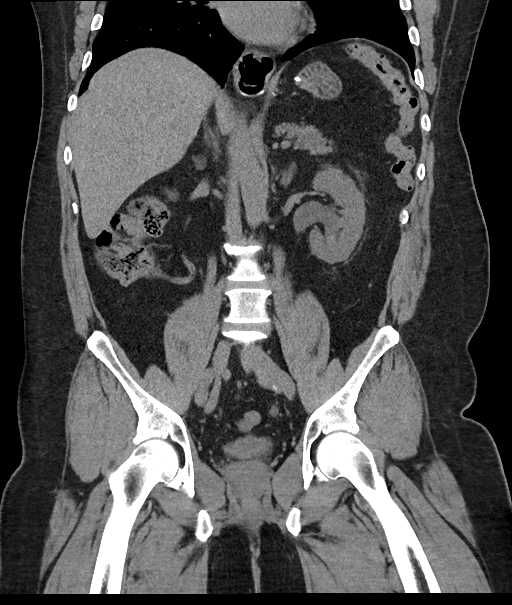

[15 of 46 positions shown; findings below may reference images not displayed]

FINDINGS: Lower chest: Small linear densities in the lower lung fields may
suggest minimal scarring or subsegmental atelectasis.

Hepatobiliary: Liver measures 20.7 cm in length. No focal
abnormality is seen. There are multiple calcified gallbladder
stones. There is no wall thickening in gallbladder.

Pancreas: No focal abnormality is seen.

Spleen: Unremarkable.

Adrenals/Urinary Tract: Adrenals are unremarkable. There is no
hydronephrosis in the right kidney. There is mild to moderate left
hydronephrosis. There is dilation of left ureter. There is no
identifiable opaque left ureteral stone. Findings may suggest recent
passage of left ureteral stone. Another less likely possibility
would be left vesical renal reflux. There are no demonstrable renal
stones. There is 1.3 cm exophytic lesion in the lower pole of left
kidney. Density measurements are higher than usual for simple cyst.
There is possible subcentimeter cyst in the upper pole of right
kidney. There is mild diffuse wall thickening in the bladder. There
is no definite opaque calculus in the bladder lumen.

Stomach/Bowel: Surgical staples are seen in the stomach. Small
hiatal hernia is seen. Small bowel loops are not dilated. Appendix
is not dilated. There is no significant wall thickening in the
colon. There is no pericolic stranding.

Vascular/Lymphatic: Scattered arterial calcifications are seen.

Reproductive: Unremarkable.

Other: There is no ascites or pneumoperitoneum.

Musculoskeletal: Unremarkable.
IMPRESSION: There is mild to moderate left hydronephrosis. There is left
hydroureter. There is no demonstrable opaque calculus in the course
of left ureter and in the lumen of urinary bladder. Findings may
suggest recent passage of left ureteral calculus. Other less likely
possibilities would include nonopaque ureteral calculus or stricture
at the left ureterovesical junction or vesicorenal reflux.

There is 1.3 cm exophytic lesion in the lower pole of left kidney
with density measurements higher than usual for simple cyst. This
may be cyst with hemorrhagic or proteinaceous fluid or solid lesion.
Follow-up renal sonogram may be considered.

There are no identifiable renal stones. There is no evidence of
intestinal obstruction or pneumoperitoneum. Appendix is not dilated.

Enlarged liver. Gallbladder stones. Previous gastric bypass. Small
hiatal hernia.

## 2023-04-22 ENCOUNTER — Other Ambulatory Visit: Payer: Self-pay | Admitting: Internal Medicine

## 2023-04-22 DIAGNOSIS — I10 Essential (primary) hypertension: Secondary | ICD-10-CM

## 2023-05-08 ENCOUNTER — Ambulatory Visit: Payer: 59 | Admitting: Internal Medicine

## 2023-05-14 ENCOUNTER — Other Ambulatory Visit: Payer: Self-pay | Admitting: Interventional Radiology

## 2023-05-14 DIAGNOSIS — N2889 Other specified disorders of kidney and ureter: Secondary | ICD-10-CM

## 2023-05-30 ENCOUNTER — Encounter: Payer: Self-pay | Admitting: Internal Medicine

## 2023-05-30 ENCOUNTER — Ambulatory Visit (INDEPENDENT_AMBULATORY_CARE_PROVIDER_SITE_OTHER): Payer: 59 | Admitting: Internal Medicine

## 2023-05-30 VITALS — BP 126/78 | HR 76 | Ht 71.0 in | Wt 296.7 lb

## 2023-05-30 DIAGNOSIS — I10 Essential (primary) hypertension: Secondary | ICD-10-CM | POA: Diagnosis not present

## 2023-05-30 DIAGNOSIS — C649 Malignant neoplasm of unspecified kidney, except renal pelvis: Secondary | ICD-10-CM | POA: Diagnosis not present

## 2023-05-30 MED ORDER — LISINOPRIL 10 MG PO TABS: 10.0000 mg | ORAL_TABLET | Freq: Every day | ORAL | 1 refills | Status: DC

## 2023-05-30 NOTE — Progress Notes (Signed)
Date:  05/30/2023   Name:  Colton Butler   DOB:  [birth year]   MRN:  [id number]   Chief Complaint: Hypertension  Hypertension This is a chronic problem. Progression since onset: 118/75 at home. The problem is controlled. Pertinent negatives include no chest pain, headaches, palpitations or shortness of breath. Past treatments include ACE inhibitors. The current treatment provides significant improvement. There is no history of kidney disease, CAD/MI or CVA.  He is exercising - swimming -  6 days per week and feels well.  His shoulder pain had improved.    Lab Results  Component Value Date   NA 141 10/25/2022   K 4.7 10/25/2022   CO2 23 10/25/2022   GLUCOSE 102 (H) 10/25/2022   BUN 11 10/25/2022   CREATININE 1.30 (H) 12/27/2022   CALCIUM 9.6 10/25/2022   EGFR 78 10/25/2022   GFRNONAA >60 09/27/2022   Lab Results  Component Value Date   CHOL 183 10/25/2022   HDL 48 10/25/2022   LDLCALC 109 (H) 10/25/2022   TRIG 148 10/25/2022   CHOLHDL 3.8 10/25/2022   No results found for: "TSH" No results found for: "HGBA1C" Lab Results  Component Value Date   WBC 4.9 09/27/2022   HGB 13.1 09/27/2022   HCT 38.3 (L) 09/27/2022   MCV 94.1 09/27/2022   PLT 246 09/27/2022   Lab Results  Component Value Date   ALT 17 10/25/2022   AST 23 10/25/2022   ALKPHOS 91 10/25/2022   BILITOT 0.5 10/25/2022   No results found for: "25OHVITD2", "25OHVITD3", "VD25OH"   Review of Systems  Constitutional:  Negative for fatigue and unexpected weight change.  HENT:  Negative for nosebleeds.   Eyes:  Negative for visual disturbance.  Respiratory:  Negative for cough, chest tightness, shortness of breath and wheezing.   Cardiovascular:  Negative for chest pain, palpitations and leg swelling.  Gastrointestinal:  Negative for abdominal pain, constipation and diarrhea.  Musculoskeletal:  Positive for arthralgias (left knee) and myalgias (mild intermittent burning pain in region of left [place]).   Neurological:  Negative for dizziness, weakness, light-headedness and headaches.    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/30/2022   Microcytic anemia 05/28/2019   Essential hypertension 05/28/2019   History of Barrett's esophagus 05/28/2019   History of bariatric surgery 05/28/2019    Allergies  Allergen Reactions   Aspirin Other (See Comments)    Due to gastric bypass (GI bleed)   Nsaids Other (See Comments)    Due to gastric bypass (GI bleed)    Past Surgical History:  Procedure Laterality Date   COLONOSCOPY WITH ESOPHAGOGASTRODUODENOSCOPY (EGD)  2020   CYSTOSCOPY/RETROGRADE/URETEROSCOPY Left 03/24/2022   Procedure: CYSTOSCOPY/RETROGRADE/URETEROSCOPY;  Surgeon: Sondra Come, MD;  Location: [facility];  Service: Urology;  Laterality: Left;   GASTRIC BLEED REPAIR  2008   IR RADIOLOGIST EVAL & MGMT  09/07/2022   IR RADIOLOGIST EVAL & MGMT  10/31/2022   IR RADIOLOGIST EVAL & MGMT  01/01/2023   KNEE ARTHROSCOPY Left 2002   meniscus   RADIOLOGY WITH ANESTHESIA Left 09/27/2022   Procedure: CT WITH ANESTHESIA GUIDED ABLATION;  Surgeon: Roanna Banning, MD;  Location: [facility];  Service: Radiology;  Laterality: Left;   ROUX-EN-Y GASTRIC BYPASS  07/2004   URETEROSCOPY Left 03/24/2022   Procedure: DIAGNOSTIC URETEROSCOPY;  Surgeon: Sondra Come, MD;  Location: [facility];  Service: Urology;  Laterality: Left;    Social History   Tobacco Use   Smoking status: Never    Passive exposure: Never   Smokeless tobacco: Never  Vaping Use   Vaping status: Never Used  Substance Use Topics   Alcohol use: Never   Drug use: Never     Medication list has been reviewed and updated.  Current Meds  Medication Sig   Blood Pressure Monitoring KIT 1 each by Does not apply route daily.   CALCIUM CITRATE PO Take 600 mg by mouth daily.   cetirizine (ZYRTEC) 10 MG tablet Take 10 mg  by mouth daily.   Iron, Ferrous Sulfate, 325 (65 Fe) MG TABS Take 325 mg by mouth 2 (two) times a week. Monday & Thursday   Multiple Vitamins-Minerals (CENTRUM SILVER PO) Take 1 tablet by mouth daily.   Omeprazole 20 MG TBEC Take 20 mg by mouth daily.   vitamin B-12 (CYANOCOBALAMIN) 1000 MCG tablet Take 1,000 mcg by mouth daily.   [DISCONTINUED] lisinopril (ZESTRIL) 10 MG tablet Take 1 tablet by mouth once daily   [DISCONTINUED] Menthol, Topical Analgesic, (ICY HOT PAIN RELIEVING EX) Apply 1 patch topically daily as needed (pain).   [DISCONTINUED] Menthol, Topical Analgesic, (ICY HOT) 5 % PTCH Apply 1 patch topically daily as needed (pain).       05/30/2023    8:22 AM 10/25/2022    8:18 AM 08/21/2022    1:28 PM 08/14/2022    4:08 PM  GAD 7 : Generalized Anxiety Score  Nervous, Anxious, on Edge 0 1 0 1  Control/stop worrying 0 1 0 0  Worry too much - different things 0 0 0 0  Trouble relaxing 0 1 0 0  Restless 0 1 0 0  Easily annoyed or irritable 0 0 1 0  Afraid - awful might happen 0 0 1 0  Total GAD 7 Score 0 4 2 1   Anxiety Difficulty Not difficult at all Not difficult at all Somewhat difficult Not difficult at all       05/30/2023    8:22 AM 10/25/2022    8:18 AM 08/21/2022    1:28 PM  Depression screen PHQ 2/9  Decreased Interest 0 0 0  Down, Depressed, Hopeless 0 0 0  PHQ - 2 Score 0 0 0  Altered sleeping 0 0 3  Tired, decreased energy 0 1 1  Change in appetite 0 0 0  Feeling bad or failure about yourself  0 0 0  Trouble concentrating 0 0 0  Moving slowly or fidgety/restless 0 0 0  Suicidal thoughts 0 0 0  PHQ-9 Score 0 1 4  Difficult doing work/chores Not difficult at all Not difficult at all Not difficult at all    BP Readings from Last 3 Encounters:  05/30/23 126/78  03/13/23 133/87  01/01/23 (!) 145/96    Physical Exam Vitals and nursing note reviewed.  Constitutional:      General: He is not in acute distress.    Appearance: Normal appearance. He is  well-developed.  HENT:     Head: Normocephalic and atraumatic.  Neck:     Vascular: No carotid bruit.  Cardiovascular:     Rate and Rhythm: Normal rate and regular rhythm.     Heart sounds: No murmur heard. Pulmonary:     Effort: Pulmonary effort is normal. No respiratory distress.     Breath sounds: No wheezing or rhonchi.  Musculoskeletal:     Cervical back: Normal range of motion.  Right lower leg: No edema.     Left lower leg: No edema.  Lymphadenopathy:     Cervical: No cervical adenopathy.  Skin:    General: Skin is warm and dry.     Findings: No rash.  Neurological:     General: No focal deficit present.     Mental Status: He is alert and oriented to person, place, and time.  Psychiatric:        Mood and Affect: Mood normal.        Behavior: Behavior normal.     Wt Readings from Last 3 Encounters:  05/30/23 296 lb 11.2 oz (134.6 kg)  03/13/23 295 lb 6.4 oz (134 kg)  01/01/23 290 lb (131.5 kg)    BP 126/78 (BP Location: Left Arm, Cuff Size: Large)   Pulse 76   Ht 5\' 11"  (1.803 m)   Wt 296 lb 11.2 oz (134.6 kg)   SpO2 100%   BMI 41.38 kg/m   Assessment and Plan:  Problem List Items Addressed This Visit       Unprioritized   Papillary renal cell carcinoma [facility])    Recent Urology follow up. Annual scan scheduled for next month He feels well other than occasional transient burning pain in left flank      Essential hypertension - Primary (Chronic)    Normal exam with stable BP on lisinopril 10 mg - dose increased last visit. No concerns or side effects to current medication. No change in regimen; continue low sodium diet.       Relevant Medications   lisinopril (ZESTRIL) 10 MG tablet    Return in about 5 months (around 10/30/2023) for CPX.    Reubin Milan, MD [facility] [place]

## 2023-05-30 NOTE — Assessment & Plan Note (Signed)
Recent Urology follow up. Annual scan scheduled for next month He feels well other than occasional transient burning pain in left flank

## 2023-05-30 NOTE — Assessment & Plan Note (Signed)
Normal exam with stable BP on lisinopril 10 mg - dose increased last visit. No concerns or side effects to current medication. No change in regimen; continue low sodium diet.

## 2023-06-13 ENCOUNTER — Other Ambulatory Visit: Payer: Self-pay | Admitting: Interventional Radiology

## 2023-06-13 DIAGNOSIS — N2889 Other specified disorders of kidney and ureter: Secondary | ICD-10-CM

## 2023-06-29 ENCOUNTER — Other Ambulatory Visit: Payer: 59

## 2023-06-29 ENCOUNTER — Ambulatory Visit
Admission: RE | Admit: 2023-06-29 | Discharge: 2023-06-29 | Disposition: A | Discharge status: Home / Self Care | Payer: 59 | Source: Ambulatory Visit | Attending: Interventional Radiology | Admitting: Interventional Radiology

## 2023-06-29 DIAGNOSIS — R109 Unspecified abdominal pain: Secondary | ICD-10-CM | POA: Diagnosis not present

## 2023-06-29 DIAGNOSIS — N2889 Other specified disorders of kidney and ureter: Secondary | ICD-10-CM

## 2023-06-29 DIAGNOSIS — Z9884 Bariatric surgery status: Secondary | ICD-10-CM | POA: Diagnosis not present

## 2023-06-29 DIAGNOSIS — K8020 Calculus of gallbladder without cholecystitis without obstruction: Secondary | ICD-10-CM | POA: Diagnosis not present

## 2023-06-29 DIAGNOSIS — C642 Malignant neoplasm of left kidney, except renal pelvis: Secondary | ICD-10-CM | POA: Diagnosis not present

## 2023-06-29 MED ORDER — IOPAMIDOL (ISOVUE-300) INJECTION 61%
100.0000 mL | Freq: Once | INTRAVENOUS | Status: AC | PRN
  Administered 2023-06-29: 100 mL via INTRAVENOUS

## 2023-07-09 ENCOUNTER — Ambulatory Visit (INDEPENDENT_AMBULATORY_CARE_PROVIDER_SITE_OTHER): Payer: 59 | Admitting: Internal Medicine

## 2023-07-09 ENCOUNTER — Encounter: Payer: Self-pay | Admitting: Internal Medicine

## 2023-07-09 VITALS — BP 122/78 | HR 72 | Ht 71.0 in | Wt 294.0 lb

## 2023-07-09 DIAGNOSIS — C649 Malignant neoplasm of unspecified kidney, except renal pelvis: Secondary | ICD-10-CM

## 2023-07-09 DIAGNOSIS — I700 Atherosclerosis of aorta: Secondary | ICD-10-CM | POA: Insufficient documentation

## 2023-07-09 DIAGNOSIS — K8020 Calculus of gallbladder without cholecystitis without obstruction: Secondary | ICD-10-CM

## 2023-07-09 MED ORDER — ATORVASTATIN CALCIUM 10 MG PO TABS: 10.0000 mg | ORAL_TABLET | Freq: Every day | ORAL | 1 refills | Status: DC

## 2023-07-09 NOTE — Progress Notes (Signed)
Date:  07/09/2023   Name:  Colton Butler   DOB:  11-15-1960   MRN:  [id number]   Chief Complaint: Abdominal Pain (URQ pain. Started on and off for 4 weeks. Patient swims in the morning and he first through it was a pulled muscle. Stopped swimming as much, and pain got better. Now pain is back on and off. No vomiting, diarrhea, or nausea. Patient does take acid reflux meds. )  Abdominal Pain This is a new problem. Episode onset: 6 weeks ago. Onset quality: intermittent. The problem occurs every several days. The most recent episode lasted 1 hour. The problem has been unchanged. The pain is located in the RUQ. The pain is mild. The quality of the pain is cramping, burning and a sensation of fullness. The abdominal pain does not radiate. Pertinent negatives include no belching, constipation, diarrhea, fever, flatus, nausea or vomiting. Nothing (most recent episode was in the evening after eating) aggravates the pain. The pain is relieved by Certain positions. He has tried nothing (takes a PPI daily) for the symptoms. Prior diagnostic workup includes CT scan (done for renal call cancer follow up showed mutliple gall stones).  Hyperlipidemia    Lab Results  Component Value Date   NA 141 10/25/2022   K 4.7 10/25/2022   CO2 23 10/25/2022   GLUCOSE 102 (H) 10/25/2022   BUN 11 10/25/2022   CREATININE 1.30 (H) 12/27/2022   CALCIUM 9.6 10/25/2022   EGFR 78 10/25/2022   GFRNONAA >60 09/27/2022   Lab Results  Component Value Date   CHOL 183 10/25/2022   HDL 48 10/25/2022   LDLCALC 109 (H) 10/25/2022   TRIG 148 10/25/2022   CHOLHDL 3.8 10/25/2022   No results found for: "TSH" No results found for: "HGBA1C" Lab Results  Component Value Date   WBC 4.9 09/27/2022   HGB 13.1 09/27/2022   HCT 38.3 (L) 09/27/2022   MCV 94.1 09/27/2022   PLT 246 09/27/2022   Lab Results  Component Value Date   ALT 17 10/25/2022   AST 23 10/25/2022   ALKPHOS 91 10/25/2022   BILITOT 0.5 10/25/2022   No  results found for: "25OHVITD2", "25OHVITD3", "VD25OH"   Patient Active Problem List   Diagnosis Date Noted   Abdominal aortic atherosclerosis (HCC) 07/09/2023   Other specified disorders of kidney and ureter 05/30/2022   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/30/2022   Microcytic anemia 05/28/2019   Essential hypertension 05/28/2019   History of Barrett's esophagus 05/28/2019   History of bariatric surgery 05/28/2019    Allergies  Allergen Reactions   Aspirin Other (See Comments)    Due to gastric bypass (GI bleed)   Nsaids Other (See Comments)    Due to gastric bypass (GI bleed)    Past Surgical History:  Procedure Laterality Date   COLONOSCOPY WITH ESOPHAGOGASTRODUODENOSCOPY (EGD)  2020   CYSTOSCOPY/RETROGRADE/URETEROSCOPY Left 03/24/2022   Procedure: CYSTOSCOPY/RETROGRADE/URETEROSCOPY;  Surgeon: Sondra Come, MD;  Location: [facility];  Service: Urology;  Laterality: Left;   GASTRIC BLEED REPAIR  2008   IR RADIOLOGIST EVAL & MGMT  09/07/2022   IR RADIOLOGIST EVAL & MGMT  10/31/2022   IR RADIOLOGIST EVAL & MGMT  01/01/2023   KNEE ARTHROSCOPY Left 2002   meniscus   RADIOLOGY WITH ANESTHESIA Left 09/27/2022   Procedure: CT WITH ANESTHESIA GUIDED ABLATION;  Surgeon: Roanna Banning, MD;  Location: [facility];  Service: Radiology;  Laterality: Left;   ROUX-EN-Y GASTRIC BYPASS  07/2004   URETEROSCOPY Left 03/24/2022  Procedure: DIAGNOSTIC URETEROSCOPY;  Surgeon: Sondra Come, MD;  Location: [facility];  Service: Urology;  Laterality: Left;    Social History   Tobacco Use   Smoking status: Never    Passive exposure: Never   Smokeless tobacco: Never  Vaping Use   Vaping status: Never Used  Substance Use Topics   Alcohol use: Never   Drug use: Never     Medication list has been reviewed and updated.  Current Meds  Medication Sig   atorvastatin (LIPITOR) 10 MG tablet Take 1 tablet (10 mg total) by mouth daily.   Blood Pressure Monitoring KIT 1 each by Does not apply  route daily.   CALCIUM CITRATE PO Take 600 mg by mouth daily.   cetirizine (ZYRTEC) 10 MG tablet Take 10 mg by mouth daily.   Iron, Ferrous Sulfate, 325 (65 Fe) MG TABS Take 325 mg by mouth 2 (two) times a week. Monday & Thursday   lisinopril (ZESTRIL) 10 MG tablet Take 1 tablet (10 mg total) by mouth daily.   Multiple Vitamins-Minerals (CENTRUM SILVER PO) Take 1 tablet by mouth daily.   Omeprazole 20 MG TBEC Take 20 mg by mouth daily.   vitamin B-12 (CYANOCOBALAMIN) 1000 MCG tablet Take 1,000 mcg by mouth daily.       05/30/2023    8:22 AM 10/25/2022    8:18 AM 08/21/2022    1:28 PM 08/14/2022    4:08 PM  GAD 7 : Generalized Anxiety Score  Nervous, Anxious, on Edge 0 1 0 1  Control/stop worrying 0 1 0 0  Worry too much - different things 0 0 0 0  Trouble relaxing 0 1 0 0  Restless 0 1 0 0  Easily annoyed or irritable 0 0 1 0  Afraid - awful might happen 0 0 1 0  Total GAD 7 Score 0 4 2 1   Anxiety Difficulty Not difficult at all Not difficult at all Somewhat difficult Not difficult at all       05/30/2023    8:22 AM 10/25/2022    8:18 AM 08/21/2022    1:28 PM  Depression screen PHQ 2/9  Decreased Interest 0 0 0  Down, Depressed, Hopeless 0 0 0  PHQ - 2 Score 0 0 0  Altered sleeping 0 0 3  Tired, decreased energy 0 1 1  Change in appetite 0 0 0  Feeling bad or failure about yourself  0 0 0  Trouble concentrating 0 0 0  Moving slowly or fidgety/restless 0 0 0  Suicidal thoughts 0 0 0  PHQ-9 Score 0 1 4  Difficult doing work/chores Not difficult at all Not difficult at all Not difficult at all    BP Readings from Last 3 Encounters:  07/09/23 122/78  05/30/23 126/78  03/13/23 133/87    Physical Exam Constitutional:      Appearance: He is well-developed.  Cardiovascular:     Rate and Rhythm: Normal rate and regular rhythm.  Pulmonary:     Effort: Pulmonary effort is normal.  Abdominal:     General: Bowel sounds are normal. There is no distension.     Palpations:  Abdomen is soft.     Tenderness: There is no abdominal tenderness. There is no guarding or rebound.  Neurological:     Mental Status: He is alert.     Wt Readings from Last 3 Encounters:  07/09/23 294 lb (133.4 kg)  05/30/23 296 lb 11.2 oz (134.6 kg)  03/13/23 295 lb 6.4  oz (134 kg)    BP 122/78   Pulse 72   Ht 5\' 11"  (1.803 m)   Wt 294 lb (133.4 kg)   SpO2 97%   BMI 41.00 kg/m   Assessment and Plan:  Problem List Items Addressed This Visit       Unprioritized   Abdominal aortic atherosclerosis (HCC)    Noted on CT abdomen. Last LDL 109 Will start atorvastatin 10 mg and recheck in February.      Relevant Medications   atorvastatin (LIPITOR) 10 MG tablet   Papillary renal cell carcinoma (HCC)    Follow up CT abd/pelvis: Normal adrenal glands. No renal calculi or hydronephrosis. No hydroureter or ureteric calculi. No bladder calculi. No suspicious renal mass. Ablation site in the lower pole left kidney measures on the order of 2.6 x 1.4 cm on 88/14 versus 3.1 x 2.5 cm when remeasured in a similar fashion on the prior. No suspicious postcontrast characteristics to suggest local recurrence. No new renal lesion.      Other Visit Diagnoses     Gall bladder stones    -  Primary   suspect gall stones as the source Rec: low fat, low protein diet refer to General surgery   Relevant Orders   Ambulatory referral to General Surgery       No follow-ups on file.    Reubin Milan, MD [facility] [place]

## 2023-07-09 NOTE — Assessment & Plan Note (Signed)
Noted on CT abdomen. Last LDL 109 Will start atorvastatin 10 mg and recheck in February.

## 2023-07-09 NOTE — Assessment & Plan Note (Signed)
Follow up CT abd/pelvis: Normal adrenal glands. No renal calculi or hydronephrosis. No hydroureter or ureteric calculi. No bladder calculi. No suspicious renal mass. Ablation site in the lower pole left kidney measures on the order of 2.6 x 1.4 cm on 88/14 versus 3.1 x 2.5 cm when remeasured in a similar fashion on the prior. No suspicious postcontrast characteristics to suggest local recurrence. No new renal lesion.

## 2023-07-12 ENCOUNTER — Other Ambulatory Visit: Payer: Self-pay | Admitting: Interventional Radiology

## 2023-07-12 DIAGNOSIS — N2889 Other specified disorders of kidney and ureter: Secondary | ICD-10-CM

## 2023-07-17 ENCOUNTER — Ambulatory Visit: Payer: 59 | Admitting: Surgery

## 2023-07-17 ENCOUNTER — Encounter: Payer: Self-pay | Admitting: Surgery

## 2023-07-17 VITALS — BP 137/102 | HR 73 | Temp 97.7°F | Ht 71.0 in | Wt 298.4 lb

## 2023-07-17 DIAGNOSIS — K8020 Calculus of gallbladder without cholecystitis without obstruction: Secondary | ICD-10-CM | POA: Insufficient documentation

## 2023-07-17 DIAGNOSIS — R109 Unspecified abdominal pain: Secondary | ICD-10-CM | POA: Insufficient documentation

## 2023-07-17 DIAGNOSIS — R0781 Pleurodynia: Secondary | ICD-10-CM

## 2023-07-17 NOTE — Patient Instructions (Addendum)
If you have any concerns or questions, please feel free to call our office. Follow up as needed.   Gallbladder Eating Plan High blood cholesterol, obesity, a sedentary lifestyle, an unhealthy diet, and diabetes are risk factors for developing gallstones. If you have a gallbladder condition, you may have trouble digesting fats and tolerating high fat intake. Eating a low-fat diet can help reduce your symptoms and may be helpful before and after having surgery to remove your gallbladder (cholecystectomy). Your health care provider may recommend that you work with a dietitian to help you reduce the amount of fat in your diet. What are tips for following this plan? General guidelines Limit your fat intake to less than 30% of your total daily calories. If you eat around 1,800 calories each day, this means eating less than 60 grams (g) of fat per day. Fat is an important part of a healthy diet. Eating a low-fat diet can make it hard to maintain a healthy body weight. Ask your dietitian how much fat, calories, and other nutrients you need each day. Eat small, frequent meals throughout the day instead of three large meals. Drink at least 8-10 cups (1.9-2.4 L) of fluid a day. Drink enough fluid to keep your urine pale yellow. If you drink alcohol: Limit how much you have to: 0-1 drink a day for women who are not pregnant. 0-2 drinks a day for men. Know how much alcohol is in a drink. In the [place] one drink equals one 12 oz bottle of beer (355 mL), one 5 oz glass of wine (148 mL), or one 1 oz glass of hard liquor (44 mL). Reading food labels  Check nutrition facts on food labels for the amount of fat per serving. Choose foods with less than 3 grams of fat per serving. Shopping Choose nonfat and low-fat healthy foods. Look for the words "nonfat," "low-fat," or "fat-free." Avoid buying processed or prepackaged foods. Cooking Cook using low-fat methods, such as baking, broiling, grilling, or  boiling. Cook with small amounts of healthy fats, such as olive oil, grapeseed oil, canola oil, avocado oil, or sunflower oil. What foods are recommended?  All fresh, frozen, or canned fruits and vegetables. Whole grains. Low-fat or nonfat (skim) milk and yogurt. Lean meat, skinless poultry, fish, eggs, and beans. Low-fat protein supplement powders or drinks. Spices and herbs. The items listed above may not be a complete list of foods and beverages you can eat and drink. Contact a dietitian for more information. What foods are not recommended? High-fat foods. These include baked goods, fast food, fatty cuts of meat, ice cream, french toast, sweet rolls, pizza, cheese bread, foods covered with butter, creamy sauces, or cheese. Fried foods. These include french fries, tempura, battered fish, breaded chicken, fried breads, and sweets. Foods that cause bloating and gas. The items listed above may not be a complete list of foods that you should avoid. Contact a dietitian for more information. Summary A low-fat diet can be helpful if you have a gallbladder condition, or before and after gallbladder surgery. Limit your fat intake to less than 30% of your total daily calories. This is about 60 g of fat if you eat 1,800 calories each day. Eat small, frequent meals throughout the day instead of three large meals. This information is not intended to replace advice given to you by your health care provider. Make sure you discuss any questions you have with your health care provider. Document Revised: 09/02/2021 Document Reviewed: 09/02/2021 Elsevier Patient Education  2024 [employer]

## 2023-07-17 NOTE — Progress Notes (Signed)
Patient ID: Colton Butler, male   DOB: 13-Nov-1960, 62 y.o.   MRN: [id number]  Chief Complaint: Gallstones and right lateral rib pain  History of Present Illness Colton Butler is a 62 y.o. male with right upper quadrant pain, somewhat sporadic.  Initially seemed to occur during swimming to where he has transitioned to water aerobics.  Sometimes it can last for hours.  Typically it does not occur after eating.  He denies any significant nausea, vomiting, fevers or chills.  He has had some loose stools.  He ate pizza on October 12 and was very gassy afterwards, but denies any right upper quadrant pain.  Did have some lower back discomfort.  He reports he had a gastric pass, and went from 411 down to 260, he is currently back up to 298.    Past Medical History Past Medical History:  Diagnosis Date   Arthritis    Barrett's esophagus    GERD (gastroesophageal reflux disease)    Headache    History of kidney stones    Hydronephrosis, left 03/2022   Hypertension    Left renal mass    Microcytic anemia    Morbid obesity (HCC)       Past Surgical History:  Procedure Laterality Date   COLONOSCOPY WITH ESOPHAGOGASTRODUODENOSCOPY (EGD)  2020   CYSTOSCOPY/RETROGRADE/URETEROSCOPY Left 03/24/2022   Procedure: CYSTOSCOPY/RETROGRADE/URETEROSCOPY;  Surgeon: Sondra Come, MD;  Location: [facility];  Service: Urology;  Laterality: Left;   GASTRIC BLEED REPAIR  2008   IR RADIOLOGIST EVAL & MGMT  09/07/2022   IR RADIOLOGIST EVAL & MGMT  10/31/2022   IR RADIOLOGIST EVAL & MGMT  01/01/2023   KNEE ARTHROSCOPY Left 2002   meniscus   RADIOLOGY WITH ANESTHESIA Left 09/27/2022   Procedure: CT WITH ANESTHESIA GUIDED ABLATION;  Surgeon: Roanna Banning, MD;  Location: [facility];  Service: Radiology;  Laterality: Left;   ROUX-EN-Y GASTRIC BYPASS  07/2004   URETEROSCOPY Left 03/24/2022   Procedure: DIAGNOSTIC URETEROSCOPY;  Surgeon: Sondra Come, MD;  Location: [facility];  Service: Urology;  Laterality: Left;    Allergies   Allergen Reactions   Aspirin Other (See Comments)    Due to gastric bypass (GI bleed)   Nsaids Other (See Comments)    Due to gastric bypass (GI bleed)    Current Outpatient Medications  Medication Sig Dispense Refill   atorvastatin (LIPITOR) 10 MG tablet Take 1 tablet (10 mg total) by mouth daily. 90 tablet 1   Blood Pressure Monitoring KIT 1 each by Does not apply route daily. 1 kit 0   CALCIUM CITRATE PO Take 600 mg by mouth daily.     cetirizine (ZYRTEC) 10 MG tablet Take 10 mg by mouth daily.     Iron, Ferrous Sulfate, 325 (65 Fe) MG TABS Take 325 mg by mouth 2 (two) times a week. Monday & Thursday     lisinopril (ZESTRIL) 10 MG tablet Take 1 tablet (10 mg total) by mouth daily. 90 tablet 1   Multiple Vitamins-Minerals (CENTRUM SILVER PO) Take 1 tablet by mouth daily.     Omeprazole 20 MG TBEC Take 20 mg by mouth daily.     vitamin B-12 (CYANOCOBALAMIN) 1000 MCG tablet Take 1,000 mcg by mouth daily.     No current facility-administered medications for this visit.    Family History History reviewed. No pertinent family history.    Social History Social History   Tobacco Use   Smoking status: Never    Passive exposure: Never  Smokeless tobacco: Never  Vaping Use   Vaping status: Never Used  Substance Use Topics   Alcohol use: Never   Drug use: Never        Review of Systems  Constitutional: Negative.   HENT: Negative.    Eyes: Negative.   Respiratory: Negative.    Cardiovascular: Negative.   Gastrointestinal:  Positive for abdominal pain and diarrhea.  Genitourinary:  Positive for flank pain.  Skin:  Positive for itching.  Neurological: Negative.   Psychiatric/Behavioral: Negative.       Physical Exam Blood pressure (!) 150/110, pulse 73, temperature 97.7 F (36.5 C), temperature source Oral, height 5\' 11"  (1.803 m), weight 298 lb 6.4 oz (135.4 kg), SpO2 97%. Last Weight  Most recent update: 07/17/2023  8:45 AM    Weight  135.4 kg (298 lb 6.4 oz)              CONSTITUTIONAL: Well developed, and nourished, appropriately responsive and aware without distress.   EYES: Sclera non-icteric.   EARS, NOSE, MOUTH AND THROAT:  The oropharynx is clear. Oral mucosa is pink and moist.    Hearing is intact to voice.  NECK: Trachea is midline, and there is no jugular venous distension.  LYMPH NODES:  Lymph nodes in the neck are not appreciated. RESPIRATORY:  Lungs are clear, and breath sounds are equal bilaterally.  Normal respiratory effort without pathologic use of accessory muscles. CARDIOVASCULAR: Heart is regular in rate and rhythm.   Well perfused.  GI: The abdomen is soft, nontender, and nondistended. MUSCULOSKELETAL:  Symmetrical muscle tone appreciated in all four extremities.    SKIN: Skin turgor is normal. No pathologic skin lesions appreciated.  NEUROLOGIC:  Motor and sensation appear grossly normal.  Cranial nerves are grossly without defect. PSYCH:  Alert and oriented to person, place and time. Affect is appropriate for situation.  Data Reviewed I have personally reviewed what is currently available of the patient's imaging, recent labs and medical records.   Labs:     Latest Ref Rng & Units 09/27/2022   10:35 AM 01/29/2022    8:55 PM  CBC  WBC 4.0 - 10.5 K/uL 4.9  9.7   Hemoglobin 13.0 - 17.0 g/dL 81.1  91.4   Hematocrit 39.0 - 52.0 % 38.3  44.8   Platelets 150 - 400 K/uL 246  290       Latest Ref Rng & Units 12/27/2022    8:59 AM 10/25/2022    8:37 AM 09/27/2022   10:35 AM  CMP  Glucose 70 - 99 mg/dL  782  956   BUN 8 - 27 mg/dL  11  14   Creatinine 2.13 - 1.24 mg/dL 0.86  5.78  4.69   Sodium 134 - 144 mmol/L  141  139   Potassium 3.5 - 5.2 mmol/L  4.7  3.9   Chloride 96 - 106 mmol/L  103  109   CO2 20 - 29 mmol/L  23  23   Calcium 8.6 - 10.2 mg/dL  9.6  9.2   Total Protein 6.0 - 8.5 g/dL  7.0    Total Bilirubin 0.0 - 1.2 mg/dL  0.5    Alkaline Phos 44 - 121 IU/L  91    AST 0 - 40 IU/L  23    ALT 0 - 44 IU/L  17         Imaging: Radiological images reviewed:    Within last 24 hrs: No results found.  Assessment  Cholelithiasis, right flank/costal pain, suspect musculoskeletal in etiology. Patient Active Problem List   Diagnosis Date Noted   Abdominal aortic atherosclerosis (HCC) 07/09/2023   Other specified disorders of kidney and ureter 05/30/2022   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/30/2022   Microcytic anemia 05/28/2019   Essential hypertension 05/28/2019   History of Barrett's esophagus 05/28/2019   History of bariatric surgery 05/28/2019    Plan    We discussed classic symptomatic cholelithiasis, and the unlikely relationship with his current pain syndrome to his gallstones.  We noted that the gallstones were incidentally identified on the CT scan that was staging his renal cancer.  Other options for pursuing etiologies of the rib pain include exercise therapy, physical therapy, evaluation by back specialist/neurosurgeon. At present the surgical option remains quite elective, and may not be the first pursuit for asymptomatic gallstones.  However we will remain readily available to assist in this gentlemen's pain syndrome and offer cholecystectomy should the demand arise.   Face-to-face time spent with the patient and accompanying care providers(if present) was 45 minutes, with more than 50% of the time spent counseling, educating, and coordinating care of the patient.    These notes generated with voice recognition software. I apologize for typographical errors.  Campbell Lerner M.D., FACS 07/17/2023, 8:55 AM

## 2023-07-26 NOTE — Progress Notes (Signed)
Reason for visit: The patient is seen in for 9 mos imaging review s/p L renal mass Bx and ablation   Care Team(s): PCP: Reubin Milan, MD Urology: Sondra Come, MD   History of present illness:   62 y/o M comorbid including morbid obesity w prior RNYGB and HTN. Pt represents for 9 month post ablation imaging review post L renal mass Bx and cryoablation. Pt had been followed with active surveillance by his Urologist, Dr. Richardo Hanks, with noted mass enlargement on imaging. Secondary to comorbidities, minimally invasive therapy was recommended. He underwent successful cryoablation by me on 09/27/22.   Pt was last seen in 3 month imaging follow up and was back to baseline. His initial post ablation scan was negative for evidence of residual disease. He is joined by his spouse, April, and they both report that he is feeling well. He denies any discomfort, hematuria or other localizing symptoms.    Review of Systems: A 12-point ROS discussed, and pertinent positives are indicated in the HPI above.  All other systems are otherwise negative.   Past Medical History:  Diagnosis Date   Arthritis    Barrett's esophagus    GERD (gastroesophageal reflux disease)    Headache    History of kidney stones    Hydronephrosis, left 03/2022   Hypertension    Left renal mass    Microcytic anemia    Morbid obesity (HCC)     Past Surgical History:  Procedure Laterality Date   COLONOSCOPY WITH ESOPHAGOGASTRODUODENOSCOPY (EGD)  2020   CYSTOSCOPY/RETROGRADE/URETEROSCOPY Left 03/24/2022   Procedure: CYSTOSCOPY/RETROGRADE/URETEROSCOPY;  Surgeon: Sondra Come, MD;  Location: [facility];  Service: Urology;  Laterality: Left;   GASTRIC BLEED REPAIR  2008   IR RADIOLOGIST EVAL & MGMT  09/07/2022   IR RADIOLOGIST EVAL & MGMT  10/31/2022   IR RADIOLOGIST EVAL & MGMT  01/01/2023   IR RADIOLOGIST EVAL & MGMT  07/27/2023   KNEE ARTHROSCOPY Left 2002   meniscus   RADIOLOGY WITH ANESTHESIA Left 09/27/2022    Procedure: CT WITH ANESTHESIA GUIDED ABLATION;  Surgeon: Roanna Banning, MD;  Location: [facility];  Service: Radiology;  Laterality: Left;   ROUX-EN-Y GASTRIC BYPASS  07/2004   URETEROSCOPY Left 03/24/2022   Procedure: DIAGNOSTIC URETEROSCOPY;  Surgeon: Sondra Come, MD;  Location: [facility];  Service: Urology;  Laterality: Left;    Allergies: Aspirin and Nsaids  Medications: Prior to Admission medications   Medication Sig Start Date End Date Taking? Authorizing Provider  atorvastatin (LIPITOR) 10 MG tablet Take 1 tablet (10 mg total) by mouth daily. 07/09/23   Reubin Milan, MD  Blood Pressure Monitoring KIT 1 each by Does not apply route daily. 08/04/22   Reubin Milan, MD  CALCIUM CITRATE PO Take 600 mg by mouth daily.    [provider]  cetirizine (ZYRTEC) 10 MG tablet Take 10 mg by mouth daily.    [provider]  Iron, Ferrous Sulfate, 325 (65 Fe) MG TABS Take 325 mg by mouth 2 (two) times a week. Monday & Thursday    [provider]  lisinopril (ZESTRIL) 10 MG tablet Take 1 tablet (10 mg total) by mouth daily. 05/30/23   Reubin Milan, MD  Multiple Vitamins-Minerals (CENTRUM SILVER PO) Take 1 tablet by mouth daily.    [provider]  Omeprazole 20 MG TBEC Take 20 mg by mouth daily.    [provider]  vitamin B-12 (CYANOCOBALAMIN) 1000 MCG tablet Take 1,000 mcg  by mouth daily.    [provider]     No family history on file.  Social History   Socioeconomic History   Marital status: Married    Spouse name: April   Number of children: 2   Years of education: Not on file   Highest education level: Not on file  Occupational History   Not on file  Tobacco Use   Smoking status: Never    Passive exposure: Never   Smokeless tobacco: Never  Vaping Use   Vaping status: Never Used  Substance and Sexual Activity   Alcohol use: Never   Drug use: Never   Sexual activity: Yes    Birth control/protection: None  Other  Topics Concern   Not on file  Social History Narrative   Not on file   Social Determinants of Health   Financial Resource Strain: Not on file  Food Insecurity: No Food Insecurity (09/27/2022)   Hunger Vital Sign    Worried About Running Out of Food in the Last Year: Never true    Ran Out of Food in the Last Year: Never true  Transportation Needs: No Transportation Needs (09/27/2022)   PRAPARE - Administrator, Civil Service (Medical): No    Lack of Transportation (Non-Medical): No  Physical Activity: Not on file  Stress: Not on file  Social Connections: Not on file     Vital Signs: BP (!) 139/95   Pulse 79   Temp 98.6 F (37 C)   Resp 20   SpO2 97%   Physical Exam  General: Obese, NAD  CV: RRR on monitor Pulm: normal work of breathing on RA Abd: S, NT, rotund MSK: Normal gait.  Psych: Appropriate affect.  Imaging:  CT AP multiphase, 06/29/23 Post ablation changes of the LEFT lower pole. No appreciable residual enhancement to suggest residual/recurrent disease.       Labs:  CBC: Recent Labs    09/27/22 1035  WBC 4.9  HGB 13.1  HCT 38.3*  PLT 246    COAGS: Recent Labs    09/27/22 1035  INR 1.2    BMP: Recent Labs    08/30/22 0932 09/27/22 1035 10/25/22 0837 12/27/22 0859  NA  --  139 141  --   K  --  3.9 4.7  --   CL  --  109 103  --   CO2  --  23 23  --   GLUCOSE  --  101* 102*  --   BUN  --  14 11  --   CALCIUM  --  9.2 9.6  --   CREATININE 1.50* 1.02 1.08 1.30*  GFRNONAA  --  >60  --   --     Pathology:   SURGICAL PATHOLOGY  CASE: [id number]  PATIENT: Colton Butler  Surgical Pathology Report    Clinical History: None, (adc)    FINAL MICROSCOPIC DIAGNOSIS:    A. LEFT RENAL MASS, CORE BIOPSY:  Papillary renal cell carcinoma, type I    Assessment and Plan:  62 y/o M comorbid w PMHx significant for morbid obesity w prior RNYGB and HTN referred for enlarging, 2.1 cm L renal mass. Pt now s/p L renal mass biopsy  and cryoablation on 09/27/22   Pathology positive for papillary RCC 9 mos post ablation CT without evidence of residual or recurrent disease.   *9 mos imaging interval / 18 mos post ablation (09/27/22) follow up imaging w multiphasic CT. *Will see him back in clinic  for imaging review. *Pt will also follow up with his Urologist, Dr. Richardo Hanks *Comorbidity follow up with PCP, Dr. Judithann Graves   Thank you for allowing [place] to participate in the care of this Patient.  Electronically Signed:  Roanna Banning, MD [facility]   Pager. [phone] Clinic. [phone]  I spent a total of 25 Minutes in face to face in clinical consultation, greater than 50% of which was counseling/coordinating care for Mr Colton Butler renal cancer follow up.

## 2023-07-27 ENCOUNTER — Ambulatory Visit
Admission: RE | Admit: 2023-07-27 | Discharge: 2023-07-27 | Disposition: A | Discharge status: Home / Self Care | Payer: 59 | Source: Ambulatory Visit | Attending: Interventional Radiology | Admitting: Interventional Radiology

## 2023-07-27 DIAGNOSIS — N2889 Other specified disorders of kidney and ureter: Secondary | ICD-10-CM

## 2023-07-27 HISTORY — PX: IR RADIOLOGIST EVAL & MGMT: IMG5224

## 2023-11-13 ENCOUNTER — Ambulatory Visit
Admission: RE | Admit: 2023-11-13 | Discharge: 2023-11-13 | Disposition: A | Discharge status: Home / Self Care | Payer: 59 | Source: Ambulatory Visit | Attending: Internal Medicine | Admitting: Internal Medicine

## 2023-11-13 ENCOUNTER — Encounter: Payer: Self-pay | Admitting: Internal Medicine

## 2023-11-13 ENCOUNTER — Ambulatory Visit
Admission: RE | Admit: 2023-11-13 | Discharge: 2023-11-13 | Disposition: A | Discharge status: Home / Self Care | Payer: 59 | Attending: Internal Medicine | Admitting: Internal Medicine

## 2023-11-13 ENCOUNTER — Ambulatory Visit (INDEPENDENT_AMBULATORY_CARE_PROVIDER_SITE_OTHER): Payer: 59 | Admitting: Internal Medicine

## 2023-11-13 VITALS — BP 124/78 | HR 89 | Ht 71.0 in | Wt 290.0 lb

## 2023-11-13 DIAGNOSIS — I10 Essential (primary) hypertension: Secondary | ICD-10-CM

## 2023-11-13 DIAGNOSIS — C649 Malignant neoplasm of unspecified kidney, except renal pelvis: Secondary | ICD-10-CM

## 2023-11-13 DIAGNOSIS — M25562 Pain in left knee: Secondary | ICD-10-CM | POA: Diagnosis not present

## 2023-11-13 DIAGNOSIS — R1011 Right upper quadrant pain: Secondary | ICD-10-CM | POA: Diagnosis not present

## 2023-11-13 DIAGNOSIS — Z0000 Encounter for general adult medical examination without abnormal findings: Secondary | ICD-10-CM | POA: Diagnosis not present

## 2023-11-13 DIAGNOSIS — Z131 Encounter for screening for diabetes mellitus: Secondary | ICD-10-CM | POA: Diagnosis not present

## 2023-11-13 DIAGNOSIS — G8929 Other chronic pain: Secondary | ICD-10-CM

## 2023-11-13 DIAGNOSIS — Z1159 Encounter for screening for other viral diseases: Secondary | ICD-10-CM | POA: Diagnosis not present

## 2023-11-13 DIAGNOSIS — Z125 Encounter for screening for malignant neoplasm of prostate: Secondary | ICD-10-CM

## 2023-11-13 DIAGNOSIS — I700 Atherosclerosis of aorta: Secondary | ICD-10-CM | POA: Diagnosis not present

## 2023-11-13 DIAGNOSIS — M1712 Unilateral primary osteoarthritis, left knee: Secondary | ICD-10-CM | POA: Diagnosis not present

## 2023-11-13 MED ORDER — GABAPENTIN 100 MG PO CAPS: 100.0000 mg | ORAL_CAPSULE | Freq: Every day | ORAL | 0 refills | Status: DC

## 2023-11-13 MED ORDER — ATORVASTATIN CALCIUM 10 MG PO TABS: 10.0000 mg | ORAL_TABLET | Freq: Every day | ORAL | 1 refills | Status: DC

## 2023-11-13 MED ORDER — LISINOPRIL 10 MG PO TABS: 10.0000 mg | ORAL_TABLET | Freq: Every day | ORAL | 1 refills | Status: DC

## 2023-11-13 NOTE — Assessment & Plan Note (Signed)
LDL is  Lab Results  Component Value Date   LDLCALC 109 (H) 10/25/2022   Current regimen is atorvastatin.  Tolerating medications well without issues.

## 2023-11-13 NOTE — Assessment & Plan Note (Signed)
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

## 2023-11-13 NOTE — Assessment & Plan Note (Signed)
06/2023 CT followup: 1. Regression of lower pole left renal ablation site, without local recurrent or metastatic disease. 2. 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 3. Cholelithiasis 4.  Aortic Atherosclerosis (ICD10-I70.0)

## 2023-11-13 NOTE — Assessment & Plan Note (Addendum)
Will get imaging and then refer to Dr. Ashley Royalty Continue Tylenol as needed

## 2023-11-13 NOTE — Progress Notes (Signed)
Date:  11/13/2023   Name:  Colton Butler   DOB:  Feb 18, 1961   MRN:  [id number]   Chief Complaint: Annual Exam Colton Butler is a 63 y.o. male who presents today for his Complete Annual Exam. He feels fairly well. He reports exercising x 6 days weekly in pool. He reports he is sleeping well.   Health Maintenance  Topic Date Due   HIV Screening  Never done   Hepatitis C Screening  Never done   DTaP/Tdap/Td vaccine (1 - Tdap) Never done   COVID-19 Vaccine (4 - 2024-25 season) 11/29/2023*   Flu Shot  12/31/2023*   Zoster (Shingles) Vaccine (1 of 2) 02/10/2024*   Colon Cancer Screening  06/23/2029   HPV Vaccine  Aged Out  *Topic was postponed. The date shown is not the original due date.    Lab Results  Component Value Date   PSA1 1.0 10/25/2022    Hypertension This is a chronic problem. The problem is controlled. Pertinent negatives include no chest pain, headaches, palpitations or shortness of breath. Past treatments include ACE inhibitors.  Hyperlipidemia This is a chronic problem. The problem is controlled. Pertinent negatives include no chest pain or shortness of breath. Current antihyperlipidemic treatment includes statins.  Knee Pain  There was no injury mechanism. The pain is present in the left knee. The quality of the pain is described as burning and aching. The pain is at a severity of 7/10. The pain is moderate. The pain has been Constant since onset.  RUQ pain - he has gallstones but surgery does not think it is the cause.  He notes intermittent random burning pain in the RUQ.  Sometimes triggered by movement.  No relation to food.  Bowel habits are unchanged.  He has taken no medications.  Review of Systems  Constitutional:  Negative for fatigue and unexpected weight change.  HENT:  Negative for nosebleeds and trouble swallowing.   Eyes:  Negative for visual disturbance.  Respiratory:  Negative for cough, chest tightness, shortness of breath and wheezing.   Cardiovascular:   Negative for chest pain, palpitations and leg swelling.  Gastrointestinal:  Positive for abdominal pain (right upper abdomen pain). Negative for constipation and diarrhea.  Musculoskeletal:  Positive for arthralgias and gait problem.  Neurological:  Negative for dizziness, weakness, light-headedness and headaches.  Psychiatric/Behavioral:  Negative for dysphoric mood and sleep disturbance. The patient is not nervous/anxious.      Lab Results  Component Value Date   NA 141 10/25/2022   K 4.7 10/25/2022   CO2 23 10/25/2022   GLUCOSE 102 (H) 10/25/2022   BUN 11 10/25/2022   CREATININE 1.30 (H) 12/27/2022   CALCIUM 9.6 10/25/2022   EGFR 78 10/25/2022   GFRNONAA >60 09/27/2022   Lab Results  Component Value Date   CHOL 183 10/25/2022   HDL 48 10/25/2022   LDLCALC 109 (H) 10/25/2022   TRIG 148 10/25/2022   CHOLHDL 3.8 10/25/2022   No results found for: "TSH" No results found for: "HGBA1C" Lab Results  Component Value Date   WBC 4.9 09/27/2022   HGB 13.1 09/27/2022   HCT 38.3 (L) 09/27/2022   MCV 94.1 09/27/2022   PLT 246 09/27/2022   Lab Results  Component Value Date   ALT 17 10/25/2022   AST 23 10/25/2022   ALKPHOS 91 10/25/2022   BILITOT 0.5 10/25/2022   No results found for: "25OHVITD2", "25OHVITD3", "VD25OH"   Patient Active Problem List   Diagnosis Date Noted  Chronic pain of left knee 11/13/2023   Right upper quadrant abdominal pain 11/13/2023   Obesity, morbid (HCC) 11/13/2023   Calculus of gallbladder without cholecystitis without obstruction 07/17/2023   Right flank pain 07/17/2023   Abdominal aortic atherosclerosis (HCC) 07/09/2023   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/30/2022   Microcytic anemia 05/28/2019   Essential hypertension 05/28/2019   History of Barrett's esophagus 05/28/2019   History of bariatric surgery 05/28/2019    Allergies  Allergen Reactions   Aspirin Other (See Comments)    Due to gastric bypass (GI bleed)   Nsaids Other  (See Comments)    Due to gastric bypass (GI bleed)    Past Surgical History:  Procedure Laterality Date   COLONOSCOPY WITH ESOPHAGOGASTRODUODENOSCOPY (EGD)  2020   CYSTOSCOPY/RETROGRADE/URETEROSCOPY Left 03/24/2022   Procedure: CYSTOSCOPY/RETROGRADE/URETEROSCOPY;  Surgeon: Sondra Come, MD;  Location: [facility];  Service: Urology;  Laterality: Left;   GASTRIC BLEED REPAIR  2008   IR RADIOLOGIST EVAL & MGMT  09/07/2022   IR RADIOLOGIST EVAL & MGMT  10/31/2022   IR RADIOLOGIST EVAL & MGMT  01/01/2023   IR RADIOLOGIST EVAL & MGMT  07/27/2023   KNEE ARTHROSCOPY Left 2002   meniscus   RADIOLOGY WITH ANESTHESIA Left 09/27/2022   Procedure: CT WITH ANESTHESIA GUIDED ABLATION;  Surgeon: Roanna Banning, MD;  Location: [facility];  Service: Radiology;  Laterality: Left;   ROUX-EN-Y GASTRIC BYPASS  07/2004   URETEROSCOPY Left 03/24/2022   Procedure: DIAGNOSTIC URETEROSCOPY;  Surgeon: Sondra Come, MD;  Location: [facility];  Service: Urology;  Laterality: Left;    Social History   Tobacco Use   Smoking status: Never    Passive exposure: Never   Smokeless tobacco: Never  Vaping Use   Vaping status: Never Used  Substance Use Topics   Alcohol use: Never   Drug use: Never     Medication list has been reviewed and updated.  Current Meds  Medication Sig   Blood Pressure Monitoring KIT 1 each by Does not apply route daily.   CALCIUM CITRATE PO Take 600 mg by mouth daily.   cetirizine (ZYRTEC) 10 MG tablet Take 10 mg by mouth daily.   gabapentin (NEURONTIN) 100 MG capsule Take 1 capsule (100 mg total) by mouth at bedtime.   Iron, Ferrous Sulfate, 325 (65 Fe) MG TABS Take 325 mg by mouth 2 (two) times a week. Monday & Thursday   Multiple Vitamins-Minerals (CENTRUM SILVER PO) Take 1 tablet by mouth daily.   Omeprazole 20 MG TBEC Take 20 mg by mouth daily.   vitamin B-12 (CYANOCOBALAMIN) 1000 MCG tablet Take 1,000 mcg by mouth daily.   [DISCONTINUED] atorvastatin (LIPITOR) 10 MG tablet Take 1  tablet (10 mg total) by mouth daily.   [DISCONTINUED] lisinopril (ZESTRIL) 10 MG tablet Take 1 tablet (10 mg total) by mouth daily.       11/13/2023    8:34 AM 05/30/2023    8:22 AM 10/25/2022    8:18 AM 08/21/2022    1:28 PM  GAD 7 : Generalized Anxiety Score  Nervous, Anxious, on Edge 0 0 1 0  Control/stop worrying 0 0 1 0  Worry too much - different things 0 0 0 0  Trouble relaxing 0 0 1 0  Restless 0 0 1 0  Easily annoyed or irritable 0 0 0 1  Afraid - awful might happen 0 0 0 1  Total GAD 7 Score 0 0 4 2  Anxiety Difficulty Not difficult at all  Not difficult at all Not difficult at all Somewhat difficult       11/13/2023    8:34 AM 05/30/2023    8:22 AM 10/25/2022    8:18 AM  Depression screen PHQ 2/9  Decreased Interest 0 0 0  Down, Depressed, Hopeless 0 0 0  PHQ - 2 Score 0 0 0  Altered sleeping 0 0 0  Tired, decreased energy 0 0 1  Change in appetite 0 0 0  Feeling bad or failure about yourself  0 0 0  Trouble concentrating 0 0 0  Moving slowly or fidgety/restless 0 0 0  Suicidal thoughts 0 0 0  PHQ-9 Score 0 0 1  Difficult doing work/chores Not difficult at all Not difficult at all Not difficult at all    BP Readings from Last 3 Encounters:  11/13/23 124/78  07/27/23 (!) 139/95  07/17/23 (!) 137/102    Physical Exam Vitals and nursing note reviewed.  Constitutional:      Appearance: Normal appearance. He is well-developed.  HENT:     Head: Normocephalic.     Right Ear: Tympanic membrane, ear canal and external ear normal.     Left Ear: Tympanic membrane, ear canal and external ear normal.     Nose: Nose normal.  Eyes:     Conjunctiva/sclera: Conjunctivae normal.     Pupils: Pupils are equal, round, and reactive to light.  Neck:     Thyroid: No thyromegaly.     Vascular: No carotid bruit.  Cardiovascular:     Rate and Rhythm: Normal rate and regular rhythm.     Heart sounds: Normal heart sounds.  Pulmonary:     Effort: Pulmonary effort is normal.      Breath sounds: Normal breath sounds. No wheezing.  Chest:  Breasts:    Right: No mass.     Left: No mass.  Abdominal:     General: Abdomen is protuberant. Bowel sounds are normal.     Palpations: Abdomen is soft.     Tenderness: There is no abdominal tenderness. There is no guarding or rebound.  Musculoskeletal:     Cervical back: Normal range of motion and neck supple.     Left knee: Deformity present. No swelling or effusion. Decreased range of motion. Tenderness present.  Lymphadenopathy:     Cervical: No cervical adenopathy.  Skin:    General: Skin is warm and dry.  Neurological:     Mental Status: He is alert and oriented to person, place, and time.     Deep Tendon Reflexes: Reflexes are normal and symmetric.  Psychiatric:        Attention and Perception: Attention normal.        Mood and Affect: Mood normal.        Thought Content: Thought content normal.     Wt Readings from Last 3 Encounters:  11/13/23 290 lb (131.5 kg)  07/17/23 298 lb 6.4 oz (135.4 kg)  07/09/23 294 lb (133.4 kg)    BP 124/78   Pulse 89   Ht 5\' 11"  (1.803 m)   Wt 290 lb (131.5 kg)   SpO2 98%   BMI 40.45 kg/m   Assessment and Plan:  Problem List Items Addressed This Visit       Unprioritized   Essential hypertension (Chronic)   Controlled BP with normal exam. Current regimen is lisinopril. Will continue same medications; encourage continued reduced sodium diet.       Relevant Medications   lisinopril (ZESTRIL) 10  MG tablet   atorvastatin (LIPITOR) 10 MG tablet   Other Relevant Orders   CBC with Differential/Platelet   Comprehensive metabolic panel   Urinalysis, Routine w reflex microscopic   Papillary renal cell carcinoma (HCC)   06/2023 CT followup: 1. Regression of lower pole left renal ablation site, without local recurrent or metastatic disease. 2. 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 3. Cholelithiasis 4.  Aortic Atherosclerosis  (ICD10-I70.0)      Relevant Orders   Comprehensive metabolic panel   Urinalysis, Routine w reflex microscopic   Abdominal aortic atherosclerosis (HCC)   LDL is  Lab Results  Component Value Date   LDLCALC 109 (H) 10/25/2022   Current regimen is atorvastatin.  Tolerating medications well without issues.       Relevant Medications   lisinopril (ZESTRIL) 10 MG tablet   atorvastatin (LIPITOR) 10 MG tablet   Other Relevant Orders   Lipid panel   Chronic pain of left knee   Will get imaging and then refer to Dr. 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 Will try gabapentin 100 mg qhs      Relevant Medications   gabapentin (NEURONTIN) 100 MG capsule   Obesity, morbid (HCC)   Other Visit Diagnoses       Annual physical exam    -  Primary   continue regular exercise continue work on health low sodium, low fat diet choices   Relevant Orders   CBC with Differential/Platelet   Comprehensive metabolic panel   Hemoglobin A1c   Hepatitis C antibody   Lipid panel   PSA   Urinalysis, Routine w reflex microscopic     Prostate cancer screening       Relevant Orders   PSA     Screening for diabetes mellitus       Relevant Orders   Hemoglobin A1c     Need for hepatitis C screening test       Relevant Orders   Hepatitis C antibody       No follow-ups on file.    Reubin Milan, MD [facility] [place]

## 2023-11-13 NOTE — Assessment & Plan Note (Signed)
Controlled BP with normal exam. Current regimen is lisinopril. Will continue same medications; encourage continued reduced sodium diet.

## 2023-11-14 ENCOUNTER — Encounter: Payer: Self-pay | Admitting: Internal Medicine

## 2023-11-14 LAB — URINALYSIS, ROUTINE W REFLEX MICROSCOPIC
Bilirubin, UA: NEGATIVE
Glucose, UA: NEGATIVE
Ketones, UA: NEGATIVE
Leukocytes,UA: NEGATIVE
Nitrite, UA: NEGATIVE
Protein,UA: NEGATIVE
RBC, UA: NEGATIVE
Specific Gravity, UA: 1.013 (ref 1.005–1.030)
Urobilinogen, Ur: 0.2 mg/dL (ref 0.2–1.0)
pH, UA: 6 (ref 5.0–7.5)

## 2023-11-14 LAB — COMPREHENSIVE METABOLIC PANEL
ALT: 34 [IU]/L (ref 0–44)
AST: 30 [IU]/L (ref 0–40)
Albumin: 4.8 g/dL (ref 3.9–4.9)
Alkaline Phosphatase: 94 [IU]/L (ref 44–121)
BUN/Creatinine Ratio: 15 (ref 10–24)
BUN: 18 mg/dL (ref 8–27)
Bilirubin Total: 0.6 mg/dL (ref 0.0–1.2)
CO2: 20 mmol/L (ref 20–29)
Calcium: 10.2 mg/dL (ref 8.6–10.2)
Chloride: 102 mmol/L (ref 96–106)
Creatinine, Ser: 1.22 mg/dL (ref 0.76–1.27)
Globulin, Total: 2.7 g/dL (ref 1.5–4.5)
Glucose: 104 mg/dL — ABNORMAL HIGH (ref 70–99)
Potassium: 4.9 mmol/L (ref 3.5–5.2)
Sodium: 138 mmol/L (ref 134–144)
Total Protein: 7.5 g/dL (ref 6.0–8.5)
eGFR: 67 mL/min/{1.73_m2} (ref >59)

## 2023-11-14 LAB — CBC WITH DIFFERENTIAL/PLATELET
Basophils Absolute: 0 10*3/uL (ref 0.0–0.2)
Basos: 1 %
EOS (ABSOLUTE): 0.2 10*3/uL (ref 0.0–0.4)
Eos: 4 %
Hematocrit: 44.2 % (ref 37.5–51.0)
Hemoglobin: 15.1 g/dL (ref 13.0–17.7)
Immature Grans (Abs): 0 10*3/uL (ref 0.0–0.1)
Immature Granulocytes: 0 %
Lymphocytes Absolute: 1.9 10*3/uL (ref 0.7–3.1)
Lymphs: 34 %
MCH: 32.8 pg (ref 26.6–33.0)
MCHC: 34.2 g/dL (ref 31.5–35.7)
MCV: 96 fL (ref 79–97)
Monocytes Absolute: 0.7 10*3/uL (ref 0.1–0.9)
Monocytes: 13 %
Neutrophils Absolute: 2.6 10*3/uL (ref 1.4–7.0)
Neutrophils: 48 %
Platelets: 260 10*3/uL (ref 150–450)
RBC: 4.6 x10E6/uL (ref 4.14–5.80)
RDW: 12.4 % (ref 11.6–15.4)
WBC: 5.5 10*3/uL (ref 3.4–10.8)

## 2023-11-14 LAB — LIPID PANEL
Chol/HDL Ratio: 2.8 {ratio} (ref 0.0–5.0)
Cholesterol, Total: 138 mg/dL (ref 100–199)
HDL: 49 mg/dL (ref >39)
LDL Chol Calc (NIH): 66 mg/dL (ref 0–99)
Triglycerides: 128 mg/dL (ref 0–149)
VLDL Cholesterol Cal: 23 mg/dL (ref 5–40)

## 2023-11-14 LAB — HEMOGLOBIN A1C
Est. average glucose Bld gHb Est-mCnc: 123 mg/dL
Hgb A1c MFr Bld: 5.9 % — ABNORMAL HIGH (ref 4.8–5.6)

## 2023-11-14 LAB — HEPATITIS C ANTIBODY: Hep C Virus Ab: NONREACTIVE

## 2023-11-14 LAB — PSA: Prostate Specific Ag, Serum: 0.8 ng/mL (ref 0.0–4.0)

## 2023-11-25 ENCOUNTER — Encounter: Payer: Self-pay | Admitting: Internal Medicine

## 2023-12-07 ENCOUNTER — Other Ambulatory Visit: Payer: Self-pay | Admitting: Internal Medicine

## 2023-12-07 DIAGNOSIS — R1011 Right upper quadrant pain: Secondary | ICD-10-CM

## 2023-12-07 NOTE — Telephone Encounter (Signed)
 Requested medication (s) are due for refill today: Yes  Requested medication (s) are on the active medication list: Yes  Last refill:  11/13/23 #30, 0 refills   Future visit scheduled: No  Notes to clinic:  Unable to refill per protocol, courtesy refill already given, routing for provider approval.      Requested Prescriptions  Pending Prescriptions Disp Refills   gabapentin (NEURONTIN) 100 MG capsule [Pharmacy Med Name: Gabapentin 100 MG Oral Capsule] 30 capsule 0    Sig: Take 1 capsule by mouth at bedtime     Neurology: Anticonvulsants - gabapentin Passed - 12/07/2023  4:59 PM      Passed - Cr in normal range and within 360 days    Creatinine, Ser  Date Value Ref Range Status  11/13/2023 1.22 0.76 - 1.27 mg/dL Final         Passed - Completed PHQ-2 or PHQ-9 in the last 360 days      Passed - Valid encounter within last 12 months    Recent Outpatient Visits           5 months ago Gall bladder stones   [facility] at [facility], Nyoka Cowden, MD   6 months ago Essential hypertension   [place] [facility] at [facility], Nyoka Cowden, MD   1 year ago Annual physical exam   [facility] at [facility], Nyoka Cowden, MD   1 year ago Essential hypertension   [place] [facility] at [facility], Nyoka Cowden, MD   1 year ago Essential hypertension   [place] [facility] at [facility], [facility]       Future Appointments             In 3 months Richardo Hanks, Laurette Schimke, MD [facility] [place]

## 2023-12-10 ENCOUNTER — Other Ambulatory Visit: Payer: Self-pay | Admitting: Internal Medicine

## 2023-12-10 DIAGNOSIS — R1011 Right upper quadrant pain: Secondary | ICD-10-CM

## 2023-12-10 NOTE — Telephone Encounter (Signed)
 Please review.  KP

## 2023-12-10 NOTE — Telephone Encounter (Signed)
 Copied from CRM [id number]. Topic: Clinical - Medication Refill >> Dec 10, 2023  9:08 AM Clayton Bibles wrote: Most Recent Primary Care Visit:  Provider: Reubin Milan  Department: [facility] [place]  Visit Type: PHYSICAL  Date: 11/13/2023  Medication: gabapentin (NEURONTIN) 100 MG capsule - This medication is working for his pain in his right side. Blood pressure is doing good.  Has the patient contacted their pharmacy? Yes (Agent: If no, request that the patient contact the pharmacy for the refill. If patient does not wish to contact the pharmacy document the reason why and proceed with request.) (Agent: If yes, when and what did the pharmacy advise?) Pharmacy needs doctor's order to refill  Is this the correct pharmacy for this prescription? Yes If no, delete pharmacy and type the correct one.  This is the patient's preferred pharmacy:  [facility] [address] [place] - [address] [place] [address] Phone: [phone] Fax: [phone]   Has the prescription been filled recently? No  Is the patient out of the medication? Yes  Has the patient been seen for an appointment in the last year OR does the patient have an upcoming appointment? Yes  Can we respond through MyChart? Yes  Agent: Please be advised that Rx refills may take up to 3 business days. We ask that you follow-up with your pharmacy.

## 2024-01-08 ENCOUNTER — Other Ambulatory Visit: Payer: Self-pay | Admitting: Internal Medicine

## 2024-01-08 DIAGNOSIS — R1011 Right upper quadrant pain: Secondary | ICD-10-CM

## 2024-01-09 NOTE — Telephone Encounter (Signed)
 OV 11/13/23 Requested Prescriptions  Pending Prescriptions Disp Refills   gabapentin (NEURONTIN) 100 MG capsule [Pharmacy Med Name: Gabapentin 100 MG Oral Capsule] 30 capsule 0    Sig: Take 1 capsule by mouth at bedtime     Neurology: Anticonvulsants - gabapentin Passed - 01/09/2024  2:48 PM      Passed - Cr in normal range and within 360 days    Creatinine, Ser  Date Value Ref Range Status  11/13/2023 1.22 0.76 - 1.27 mg/dL Final         Passed - Completed PHQ-2 or PHQ-9 in the last 360 days      Passed - Valid encounter within last 12 months    Recent Outpatient Visits           1 month ago Annual physical exam   [facility] at [facility], Nyoka Cowden, MD       Future Appointments             In 2 months Richardo Hanks, Laurette Schimke, MD [facility] [place]

## 2024-02-04 ENCOUNTER — Other Ambulatory Visit: Payer: Self-pay | Admitting: Internal Medicine

## 2024-02-04 DIAGNOSIS — R1011 Right upper quadrant pain: Secondary | ICD-10-CM

## 2024-02-06 NOTE — Telephone Encounter (Signed)
 Requested Prescriptions  Pending Prescriptions Disp Refills   gabapentin  (NEURONTIN ) 100 MG capsule [Pharmacy Med Name: Gabapentin  100 MG Oral Capsule] 90 capsule 0    Sig: Take 1 capsule by mouth at bedtime     Neurology: Anticonvulsants - gabapentin  Passed - 02/06/2024  8:13 AM      Passed - Cr in normal range and within 360 days    Creatinine, Ser  Date Value Ref Range Status  11/13/2023 1.22 0.76 - 1.27 mg/dL Final         Passed - Completed PHQ-2 or PHQ-9 in the last 360 days      Passed - Valid encounter within last 12 months    Recent Outpatient Visits           2 months ago Annual physical exam   [facility] Medicine at [facility], Chales [place], MD       Future Appointments             In 1 month Estanislao Heimlich, Dennard Fisher, MD [facility] [place]

## 2024-03-11 ENCOUNTER — Ambulatory Visit: Payer: Self-pay | Admitting: Urology

## 2024-03-11 ENCOUNTER — Ambulatory Visit (INDEPENDENT_AMBULATORY_CARE_PROVIDER_SITE_OTHER): Admitting: Urology

## 2024-03-11 VITALS — BP 145/95 | HR 105 | Ht 71.0 in | Wt 273.0 lb

## 2024-03-11 DIAGNOSIS — N138 Other obstructive and reflux uropathy: Secondary | ICD-10-CM | POA: Diagnosis not present

## 2024-03-11 DIAGNOSIS — N1339 Other hydronephrosis: Secondary | ICD-10-CM | POA: Diagnosis not present

## 2024-03-11 DIAGNOSIS — N401 Enlarged prostate with lower urinary tract symptoms: Secondary | ICD-10-CM

## 2024-03-11 DIAGNOSIS — N2889 Other specified disorders of kidney and ureter: Secondary | ICD-10-CM | POA: Diagnosis not present

## 2024-03-11 LAB — BLADDER SCAN AMB NON-IMAGING: Scan Result: 112

## 2024-03-11 NOTE — Progress Notes (Signed)
   03/11/2024 2:29 PM   Colton Butler [birth year] [id number]  Reason for visit: Follow up Left hydronephrosis, papillary RCC s/p ablation with IR, urinary symptoms, PSA screening  HPI: 63 year old male with obesity and BMI of 38 we have followed for the above issues.  He has an interesting presentation.  He originally presented to the ER in April 2023 with severe left-sided flank pain and moderate left hydronephrosis down to the bladder with unclear etiology.  Follow-up ultrasound showed persistent left hydronephrosis and he ultimately underwent cystoscopy, left retrograde pyelogram showing narrowing of the left distal ureter, and left diagnostic ureteroscopy, balloon dilation of distal ureter, and stent placement.  He has done well since then with no recurrence of left-sided flank pain, no hydronephrosis on follow-up imaging.  He also underwent percutaneous biopsy and ablation of a 2 cm left renal mass in December 2023 with interventional radiology, with pathology showing papillary RCC type I.  He follows with radiology for surveillance.  I personally viewed and interpreted the most recent CT abdomen pelvis with contrast from symptom 2024 that shows regression of the renal ablation site, no hydronephrosis.  He continues to have some left-sided flank burning after his ablation.  He was recently started on gabapentin  for right sided abdominal pain by his PCP, and I recommended he try taking this twice a day or increasing the dose to see if this helps with his left-sided pain.  Reassurance provided regarding CT findings, and resolution of hydronephrosis and small renal mass.  He denies any worsening of urinary symptoms, has minimally bothersome nocturia 1-2 times at night.  PVR today normal at 30ml.  Recent PSA normal at 0.8, stable from prior.  Reassurance provided.  Continue follow-up with IR for routine surveillance after percutaneous ablation Consider increasing gabapentin  to twice daily from  PCP RTC 1 year PVR  Lawerence Pressman, MD  [facility] [address], [address] [place], [place] [address] [phone]

## 2024-03-11 NOTE — Patient Instructions (Signed)
 I would recommend trying the gabapentin  twice daily to see if this helps with the burning on your left side.  You can stay at the 100 mg dose, if you are seeing some improvement the dose could certainly be increased as this is a very low dose.  Nocturia refers to the need to wake up during the night to urinate, which can disrupt your sleep and impact your overall well-being. Fortunately, there are several strategies you can employ to help prevent or manage nocturia. It's important to consult with your healthcare provider before making any significant changes to your routine. Here are some helpful strategies to consider:  Limit Fluid Intake Before Bed: Avoid drinking large amounts of fluids in the evening, especially within a few hours of bedtime. Consume most of your daily fluid intake earlier in the day to reduce the need to urinate at night.  Monitor Your Diet: Limit your intake of caffeine and alcohol, as these substances can increase urine production and irritate the bladder.  Avoid diet, "zero calorie," and artificially sweetened drinks, especially sodas, in the afternoon or evening. Be mindful of consuming foods and drinks with high water content before bedtime, such as watermelon and herbal teas.  Time Your Medications: If you're taking medications that contribute to increased urination, consult your healthcare provider about adjusting the timing of these medications to minimize their impact during the night.  Practice Double Voiding: Before going to bed, make an effort to empty your bladder twice within a short period. This can help reduce the amount of urine left in your bladder before sleep.  Bladder Training: Gradually increase the time between bathroom visits during the day to train your bladder to hold larger volumes of urine. Over time, this can help reduce the frequency of nighttime awakenings to urinate.  Elevate Your Legs During the Day: Elevating your legs during the day can  help minimize fluid retention in your lower extremities, which might reduce nighttime urination.  Pelvic Floor Exercises: Strengthening your pelvic floor muscles through Kegel exercises can help improve bladder control and potentially reduce the urge to urinate at night.  Create a Relaxing Bedtime Routine: Stress and anxiety can exacerbate nocturia. Engage in calming activities before bed, such as reading, listening to soothing music, or practicing relaxation techniques.  Stay Active: Engage in regular physical activity, but avoid intense exercise close to bedtime, as this can increase your body's demand for fluids.  Maintain a Healthy Weight: Excess weight can compress the bladder and contribute to bladder and urinary issues. Aim to achieve and maintain a healthy weight through a balanced diet and regular exercise.  Remember that every individual is unique, and the effectiveness of these strategies may vary. It's important to work with your healthcare provider to develop a plan that suits your specific needs and addresses any underlying causes of nocturia.

## 2024-03-19 ENCOUNTER — Other Ambulatory Visit: Payer: Self-pay | Admitting: Internal Medicine

## 2024-03-19 ENCOUNTER — Other Ambulatory Visit: Payer: Self-pay | Admitting: Interventional Radiology

## 2024-03-19 DIAGNOSIS — N2889 Other specified disorders of kidney and ureter: Secondary | ICD-10-CM

## 2024-03-19 NOTE — Telephone Encounter (Signed)
 Copied from CRM [id number]. Topic: Clinical - Medication Refill >> Mar 19, 2024  3:38 PM Carla L wrote: Medication: hydrochlorothiazide  (HYDRODIURIL ) 12.5 MG tablet  Has the patient contacted their pharmacy? Yes Pharmacy calling on behalf of pt.  This is the patient's preferred pharmacy:  [facility] [address] [place] - [address] [place] [address] Phone: [phone] Fax: [phone]   Is this the correct pharmacy for this prescription? Yes  Has the prescription been filled recently? No, last filled 08/14/2022 per [facility] system.  Is the patient out of the medication? Unsure, pharmacy calling on behalf of pt.   Has the patient been seen for an appointment in the last year OR does the patient have an upcoming appointment? Yes  Can we respond through MyChart? No  Agent: Please be advised that Rx refills may take up to 3 business days. We ask that you follow-up with your pharmacy.

## 2024-03-20 ENCOUNTER — Ambulatory Visit
Admission: RE | Admit: 2024-03-20 | Discharge: 2024-03-20 | Disposition: A | Discharge status: Home / Self Care | Source: Ambulatory Visit | Attending: Interventional Radiology | Admitting: Interventional Radiology

## 2024-03-20 DIAGNOSIS — K8020 Calculus of gallbladder without cholecystitis without obstruction: Secondary | ICD-10-CM | POA: Diagnosis not present

## 2024-03-20 DIAGNOSIS — I700 Atherosclerosis of aorta: Secondary | ICD-10-CM | POA: Diagnosis not present

## 2024-03-20 DIAGNOSIS — N2889 Other specified disorders of kidney and ureter: Secondary | ICD-10-CM | POA: Diagnosis not present

## 2024-03-20 MED ORDER — IOPAMIDOL (ISOVUE-300) INJECTION 61%: 100.0000 mL | Freq: Once | INTRAVENOUS | Status: AC | PRN

## 2024-03-21 ENCOUNTER — Other Ambulatory Visit: Payer: Self-pay

## 2024-03-21 MED ORDER — HYDROCHLOROTHIAZIDE 12.5 MG PO TABS: 12.5000 mg | ORAL_TABLET | Freq: Every day | ORAL | 0 refills | Status: DC

## 2024-03-21 NOTE — Telephone Encounter (Signed)
 Requested medications are due for refill today.  unsure  Requested medications are on the active medications list.  yes  Last refill. 03/11/2024   Future visit scheduled.   no  Notes to clinic.  Mediation is historical    Requested Prescriptions  Pending Prescriptions Disp Refills   hydrochlorothiazide  (HYDRODIURIL ) 12.5 MG tablet      Sig: Take 1 tablet (12.5 mg total) by mouth daily.     Cardiovascular: Diuretics - Thiazide Failed - 03/21/2024  2:27 PM      Failed - Last BP in normal range    BP Readings from Last 1 Encounters:  03/11/24 (!) 145/95         Passed - Cr in normal range and within 180 days    Creatinine, Ser  Date Value Ref Range Status  11/13/2023 1.22 0.76 - 1.27 mg/dL Final         Passed - K in normal range and within 180 days    Potassium  Date Value Ref Range Status  11/13/2023 4.9 3.5 - 5.2 mmol/L Final         Passed - Na in normal range and within 180 days    Sodium  Date Value Ref Range Status  11/13/2023 138 134 - 144 mmol/L Final         Passed - Valid encounter within last 6 months    Recent Outpatient Visits           4 months ago Annual physical exam   [facility] at [facility], Chales [place], MD       Future Appointments             In 11 months Estanislao Heimlich, Dennard Fisher, MD [facility] [place]

## 2024-03-25 ENCOUNTER — Ambulatory Visit
Admission: RE | Admit: 2024-03-25 | Discharge: 2024-03-25 | Disposition: A | Discharge status: Home / Self Care | Source: Ambulatory Visit | Attending: Interventional Radiology

## 2024-03-25 DIAGNOSIS — C641 Malignant neoplasm of right kidney, except renal pelvis: Secondary | ICD-10-CM | POA: Diagnosis not present

## 2024-03-25 DIAGNOSIS — N2889 Other specified disorders of kidney and ureter: Secondary | ICD-10-CM

## 2024-03-25 HISTORY — PX: IR RADIOLOGIST EVAL & MGMT: IMG5224

## 2024-03-25 NOTE — Progress Notes (Signed)
 Reason for visit: 18 mos imaging review s/p L renal mass Bx and ablation   Care Team(s): PCP: Justus Leita DEL, MD Urology: Francisca Redell BROCKS, MD   History of present illness:   Mr. Colton Butler is a pleasant 63 y/o M comorbid including morbid obesity w prior RNYGB and HTN. Pt represents for 9 month post ablation imaging review post L renal mass Bx and cryoablation. Pt had been followed with active surveillance by his Urologist, Dr. Francisca, with noted mass enlargement on imaging. Secondary to comorbidities, minimally invasive therapy was recommended. He underwent successful cryoablation by me on 09/27/22.   Pt was last seen in 9 month imaging follow up and was back to baseline. His initial post ablation scan was negative for evidence of residual disease. He is joined by his spouse, Colton Butler, and they both report that he is feeling well. He denies any discomfort, hematuria or other localizing symptoms.    Review of Systems: A 12-point ROS discussed, and pertinent positives are indicated in the HPI above.  All other systems are otherwise negative.  Past Medical History:  Diagnosis Date   Arthritis    Barrett's esophagus    GERD (gastroesophageal reflux disease)    Headache    History of kidney stones    Hydronephrosis, left 03/2022   Hypertension    Left renal mass    Microcytic anemia    Morbid obesity (HCC)     Past Surgical History:  Procedure Laterality Date   COLONOSCOPY WITH ESOPHAGOGASTRODUODENOSCOPY (EGD)  2020   CYSTOSCOPY/RETROGRADE/URETEROSCOPY Left 03/24/2022   Procedure: CYSTOSCOPY/RETROGRADE/URETEROSCOPY;  Surgeon: Francisca Redell BROCKS, MD;  Location: [facility] ORS;  Service: Urology;  Laterality: Left;   GASTRIC BLEED REPAIR  2008   IR RADIOLOGIST EVAL & MGMT  09/07/2022   IR RADIOLOGIST EVAL & MGMT  10/31/2022   IR RADIOLOGIST EVAL & MGMT  01/01/2023   IR RADIOLOGIST EVAL & MGMT  07/27/2023   KNEE ARTHROSCOPY Left 2002   meniscus   RADIOLOGY WITH ANESTHESIA Left 09/27/2022    Procedure: CT WITH ANESTHESIA GUIDED ABLATION;  Surgeon: Hughes Simmonds, MD;  Location: [facility];  Service: Radiology;  Laterality: Left;   ROUX-EN-Y GASTRIC BYPASS  07/2004   URETEROSCOPY Left 03/24/2022   Procedure: DIAGNOSTIC URETEROSCOPY;  Surgeon: Francisca Redell BROCKS, MD;  Location: [facility] ORS;  Service: Urology;  Laterality: Left;    Allergies: Aspirin and Nsaids  Medications: Prior to Admission medications   Medication Sig Start Date End Date Taking? Authorizing Provider  atorvastatin  (LIPITOR) 10 MG tablet Take 1 tablet (10 mg total) by mouth daily. 11/13/23  Yes Justus Leita DEL, MD  CALCIUM  CITRATE PO Take 600 mg by mouth daily.   Yes [provider]  cetirizine (ZYRTEC) 10 MG tablet Take 10 mg by mouth daily.   Yes [provider]  gabapentin  (NEURONTIN ) 100 MG capsule Take 1 capsule by mouth at bedtime 02/06/24  Yes Justus Leita DEL, MD  hydrochlorothiazide  (HYDRODIURIL ) 12.5 MG tablet Take 1 tablet (12.5 mg total) by mouth daily. 03/21/24  Yes Berglund, Laura H, MD  Iron, Ferrous Sulfate, 325 (65 Fe) MG TABS Take 325 mg by mouth 2 (two) times a week. Monday & Thursday   Yes [provider]  lisinopril  (ZESTRIL ) 10 MG tablet Take 1 tablet (10 mg total) by mouth daily. 11/13/23  Yes Justus Leita DEL, MD  Multiple Vitamins-Minerals (CENTRUM SILVER  PO) Take 1 tablet by mouth daily.   Yes [provider]  Omeprazole 20 MG TBEC Take  20 mg by mouth daily.   Yes [provider]  vitamin B-12 (CYANOCOBALAMIN ) 1000 MCG tablet Take 1,000 mcg by mouth daily.   Yes [provider]  Blood Pressure Monitoring KIT 1 each by Does not apply route daily. 08/04/22   Justus Leita DEL, MD     No family history on file.  Social History   Socioeconomic History   Marital status: Married    Spouse name: Colton Butler   Number of children: 2   Years of education: Not on file   Highest education level: Not on file  Occupational History   Not on file   Tobacco Use   Smoking status: Never    Passive exposure: Never   Smokeless tobacco: Never  Vaping Use   Vaping status: Never Used  Substance and Sexual Activity   Alcohol use: Never   Drug use: Never   Sexual activity: Yes    Birth control/protection: None  Other Topics Concern   Not on file  Social History Narrative   Not on file   Social Drivers of Health   Financial Resource Strain: Not on file  Food Insecurity: No Food Insecurity (09/27/2022)   Hunger Vital Sign    Worried About Running Out of Food in the Last Year: Never true    Ran Out of Food in the Last Year: Never true  Transportation Needs: No Transportation Needs (09/27/2022)   PRAPARE - Administrator, Civil Service (Medical): No    Lack of Transportation (Non-Medical): No  Physical Activity: Not on file  Stress: Not on file  Social Connections: Not on file     Vital Signs: BP (!) 127/92 (BP Location: Left Arm, Patient Position: Sitting, Cuff Size: Large)   Pulse 80   Temp 98.2 F (36.8 C) (Oral)   Resp 16   SpO2 97%   Physical Exam  General: WN, NAD  CV: RRR on monitor Pulm: normal work of breathing on RA Abd: S, ND, NT MSK: Grossly normal Psych: Appropriate affect.  Imaging:    CT AP W/WO, 03/20/24 IMPRESSION:  Continued contraction of post ablation changes in the lower pole left kidney.  No evidence of recurrent or metastatic disease in the abdomen or pelvis.  No results found.  Labs:  CBC: Recent Labs    11/13/23 0957  WBC 5.5  HGB 15.1  HCT 44.2  PLT 260    COAGS: No results for input(s): INR, APTT in the last 8760 hours.  BMP: Recent Labs    11/13/23 0957  NA 138  K 4.9  CL 102  CO2 20  GLUCOSE 104*  BUN 18  CALCIUM  10.2  CREATININE 1.22   Pathology:   SURGICAL PATHOLOGY  CASE: [id number]  PATIENT: Colton Butler  Surgical Pathology Report    Clinical History: None, (adc)    FINAL MICROSCOPIC DIAGNOSIS:    A. LEFT RENAL MASS, CORE  BIOPSY:  Papillary renal cell carcinoma, type I    Assessment and Plan:  63 y/o M comorbid w PMHx significant for morbid obesity w prior RNYGB and HTN referred for enlarging, 2.1 cm L renal mass.  Pt is s/p L renal mass biopsy and cryoablation on 09/27/22   Pathology positive for papillary RCC 18 mos post ablation CT without evidence of residual or recurrent disease.   *12 mos imaging interval / 30 mos post ablation (09/27/22) follow up imaging w multiphasic CT. *Will see him back in clinic for imaging review. *Pt will also follow up  with his Urologist, Dr. Francisca *Comorbidity follow up with PCP, Dr. Justus   Thank you for allowing us  to participate in the care of this Patient.  Electronically Signed:  Thom Hall, MD [facility]   Pager. [phone] Clinic. [phone]  I spent a total of 25 Minutes in face to face in clinical consultation, greater than 50% of which was counseling/coordinating care for Mr Colton Butler renal cancer follow up.

## 2024-04-21 NOTE — Addendum Note (Signed)
 Encounter addended by: Hughes Simmonds, MD on: 04/21/2024 8:41 AM  Actions taken: Clinical Note Signed

## 2024-04-23 ENCOUNTER — Ambulatory Visit: Payer: Self-pay

## 2024-04-23 NOTE — Telephone Encounter (Signed)
 FYI Only or Action Required?: Action required by provider: Medication request for gout.  Patient was last seen in primary care on 11/13/2023 by Justus Leita DEL, MD.  Called Nurse Triage reporting Foot Pain.  Symptoms began 2 days ago.  Interventions attempted: Rest, hydration, or home remedies.  Symptoms are: unchanged.  Triage Disposition: See PCP When Office is Open (Within 3 Days)  Patient/caregiver understands and will follow disposition?: No, wishes to speak with PCP      Copied from CRM #[id number]. Topic: Clinical - Medication Question >> Apr 23, 2024  2:55 PM Tiffany S wrote: Reason for CRM: Patient is having a gout flare up patient is asking if provider can call in a prescription for treatment please follow up with patient     Reason for Disposition  Pain in the big toe joint    Pain is in the ankle joint but patient states is consistent with previous gout flares  Answer Assessment - Initial Assessment Questions Patient has declined an appointment and is requesting medication for gout. Please advise.        1. ONSET: When did the pain start?      2 days ago 2. LOCATION: Where is the pain located?      Right foot/ankle  3. PAIN: How bad is the pain?    (Scale 1-10; or mild, moderate, severe)     7/10 4. WORK OR EXERCISE: Has there been any recent work or exercise that involved this part of the body?      No 5. CAUSE: What do you think is causing the foot pain?     Believes it is gout 6. OTHER SYMPTOMS: Do you have any other symptoms? (e.g., leg pain, rash, fever, numbness)     Difficulty walking due to pain  Protocols used: Foot Pain-A-AH

## 2024-04-24 ENCOUNTER — Encounter: Payer: Self-pay | Admitting: Family Medicine

## 2024-04-24 ENCOUNTER — Ambulatory Visit (INDEPENDENT_AMBULATORY_CARE_PROVIDER_SITE_OTHER): Admitting: Family Medicine

## 2024-04-24 ENCOUNTER — Ambulatory Visit: Payer: Self-pay

## 2024-04-24 VITALS — BP 124/76 | HR 106 | Temp 98.3°F | Ht 71.0 in | Wt 264.5 lb

## 2024-04-24 DIAGNOSIS — M109 Gout, unspecified: Secondary | ICD-10-CM | POA: Diagnosis not present

## 2024-04-24 MED ORDER — PREDNISONE 10 MG PO TABS: ORAL_TABLET | ORAL | 0 refills | Status: AC

## 2024-04-24 NOTE — Telephone Encounter (Signed)
 FYI Only or Action Required?: FYI only for provider.  Patient was last seen in primary care on 11/13/2023 by Justus Leita DEL, MD.  Called Nurse Triage reporting Gout.  Symptoms began several days ago.  Interventions attempted: Rest, hydration, or home remedies.  Symptoms are: gradually worsening.  Triage Disposition: No disposition on file.  Patient/caregiver understands and will follow disposition?:  Copied from CRM [id number]. Topic: Clinical - Red Word Triage >> Apr 24, 2024  8:34 AM Ivette P wrote: Red Word that prompted transfer to Nurse Triage:  bad gout, on right foot. flare up. Worst its ever been  Patient following up on NT call from yesterday (see 7/23 NT note).  Pt asking to be scheduled for this afternoon now that he has transportation. Appt scheduled for 7/24 at 3pm with Dr. Sol

## 2024-04-24 NOTE — Progress Notes (Signed)
   Acute Office Visit  Subjective:     Patient ID: Colton Butler, male    DOB: [birth year], 63 y.o.   MRN: [id number]  Chief Complaint  Patient presents with   Gout    Right foot, x 2 days, swollen, unable to walk on foot,     History of Gout 2020, Diagnosed in [place] .   Five till now.  Takes colchine which controls the symptoms.   He is out of the pills.   Location : Right foot. Toes are tneder.  7/10.  Cold pack and elevation with no relief.  Triggers: Not sure.  No uric acid levels on file.    Patient is in today for gout flare up.  Review of Systems  All other systems reviewed and are negative.       Objective:    BP 124/76   Pulse (!) 106   Temp 98.3 F (36.8 C) (Oral)   Ht 5' 11 (1.803 m)   Wt 264 lb 8 oz (120 kg)   SpO2 98%   BMI 36.89 kg/m    Physical Exam Vitals and nursing note reviewed.  Constitutional:      Appearance: Normal appearance.  HENT:     Head: Normocephalic.     Right Ear: External ear normal.     Left Ear: External ear normal.  Eyes:     Conjunctiva/sclera: Conjunctivae normal.  Cardiovascular:     Rate and Rhythm: Normal rate.  Pulmonary:     Effort: Pulmonary effort is normal. No respiratory distress.  Abdominal:     Palpations: Abdomen is soft.  Musculoskeletal:        General: Swelling and tenderness present. Normal range of motion.     Comments: Right foot mildly swollen , erythematous, tender to touch, ROM restricted due to pain.  Skin:    General: Skin is warm.  Neurological:     Mental Status: He is alert and oriented to person, place, and time.  Psychiatric:        Mood and Affect: Mood normal.     No results found for any visits on 04/24/24.      Assessment & Plan:   Problem List Items Addressed This Visit   None Visit Diagnoses       Acute gout of left foot, unspecified cause    -  Primary   Relevant Medications   predniSONE  (DELTASONE ) 10 MG tablet       Meds ordered this encounter   Medications   predniSONE  (DELTASONE ) 10 MG tablet    Sig: Take 6 tablets (60 mg total) by mouth daily with breakfast for 1 day, THEN 5 tablets (50 mg total) daily with breakfast for 1 day, THEN 4 tablets (40 mg total) daily with breakfast for 1 day, THEN 3 tablets (30 mg total) daily with breakfast for 1 day, THEN 2 tablets (20 mg total) daily with breakfast for 1 day, THEN 1 tablet (10 mg total) daily with breakfast for 1 day.    Dispense:  21 tablet    Refill:  0  Gout attack:  RICE, steroid taper, Consider Uric acid levels and possibly starting Allopurinol. RTC 3 months.  Return in about 3 months (around 07/25/2024).  Vinary K Kebra Lowrimore, MD

## 2024-05-05 ENCOUNTER — Other Ambulatory Visit: Payer: Self-pay | Admitting: Internal Medicine

## 2024-05-05 DIAGNOSIS — R1011 Right upper quadrant pain: Secondary | ICD-10-CM

## 2024-05-06 NOTE — Telephone Encounter (Signed)
 Requested Prescriptions  Pending Prescriptions Disp Refills   gabapentin  (NEURONTIN ) 100 MG capsule [Pharmacy Med Name: Gabapentin  100 MG Oral Capsule] 90 capsule 0    Sig: Take 1 capsule by mouth at bedtime     Neurology: Anticonvulsants - gabapentin  Passed - 05/06/2024 12:04 PM      Passed - Cr in normal range and within 360 days    Creatinine, Ser  Date Value Ref Range Status  11/13/2023 1.22 0.76 - 1.27 mg/dL Final         Passed - Completed PHQ-2 or PHQ-9 in the last 360 days      Passed - Valid encounter within last 12 months    Recent Outpatient Visits           1 week ago Acute gout of left foot, unspecified cause   [place] [facility] at [facility], Vinay K, MD   5 months ago Annual physical exam   [facility] Care [facility] Medicine at [facility], Leita DEL, MD       Future Appointments             In 10 months Francisca, Redell BROCKS, MD [facility] [place]

## 2024-06-05 ENCOUNTER — Encounter: Payer: Self-pay | Admitting: Urology

## 2024-06-27 ENCOUNTER — Other Ambulatory Visit: Payer: Self-pay | Admitting: Internal Medicine

## 2024-06-27 NOTE — Telephone Encounter (Signed)
 Requested medications are due for refill today.  yes  Requested medications are on the active medications list.  yes  Last refill. 03/21/2024 #90 0 rf  Future visit scheduled.   yes  Notes to clinic.  Labs are expired    Requested Prescriptions  Pending Prescriptions Disp Refills   hydrochlorothiazide  (HYDRODIURIL ) 12.5 MG tablet [Pharmacy Med Name: HYDROCHLOROTHIAZIDE  12.5MG  TAB] 90 tablet 0    Sig: Take 1 tablet by mouth once daily     Cardiovascular: Diuretics - Thiazide Failed - 06/27/2024  4:26 PM      Failed - Cr in normal range and within 180 days    Creatinine, Ser  Date Value Ref Range Status  11/13/2023 1.22 0.76 - 1.27 mg/dL Final         Failed - K in normal range and within 180 days    Potassium  Date Value Ref Range Status  11/13/2023 4.9 3.5 - 5.2 mmol/L Final         Failed - Na in normal range and within 180 days    Sodium  Date Value Ref Range Status  11/13/2023 138 134 - 144 mmol/L Final         Failed - Valid encounter within last 6 months    Recent Outpatient Visits           2 months ago Acute gout of left foot, unspecified cause   [place] [facility] at [facility] [place] Kotturi, Vinay K, MD   7 months ago Annual physical exam   [facility] at [facility], Leita DEL, MD       Future Appointments             In 8 months Francisca Redell BROCKS, MD [facility] [place]            Passed - Last BP in normal range    BP Readings from Last 1 Encounters:  04/24/24 124/76

## 2024-07-07 ENCOUNTER — Encounter: Admitting: Family Medicine

## 2024-07-28 ENCOUNTER — Ambulatory Visit: Admitting: Family Medicine

## 2024-07-28 ENCOUNTER — Ambulatory Visit: Admitting: Internal Medicine

## 2024-07-31 ENCOUNTER — Other Ambulatory Visit: Payer: Self-pay | Admitting: Internal Medicine

## 2024-07-31 DIAGNOSIS — I700 Atherosclerosis of aorta: Secondary | ICD-10-CM

## 2024-08-01 NOTE — Telephone Encounter (Signed)
 Requested Prescriptions  Pending Prescriptions Disp Refills   atorvastatin  (LIPITOR) 10 MG tablet [Pharmacy Med Name: Atorvastatin  Calcium  10 MG Oral Tablet] 90 tablet 0    Sig: Take 1 tablet by mouth once daily     Cardiovascular:  Antilipid - Statins Failed - 08/01/2024  3:36 PM      Failed - Lipid Panel in normal range within the last 12 months    Cholesterol, Total  Date Value Ref Range Status  11/13/2023 138 100 - 199 mg/dL Final   LDL Chol Calc (NIH)  Date Value Ref Range Status  11/13/2023 66 0 - 99 mg/dL Final   HDL  Date Value Ref Range Status  11/13/2023 49 >39 mg/dL Final   Triglycerides  Date Value Ref Range Status  11/13/2023 128 0 - 149 mg/dL Final         Passed - Patient is not pregnant      Passed - Valid encounter within last 12 months    Recent Outpatient Visits           3 months ago Acute gout of left foot, unspecified cause   [place] [facility] Care & Sports Medicine at [facility] Kotturi, Vinay K, MD   8 months ago Annual physical exam   [facility] Primary Care & Sports Medicine at [facility], Leita DEL, MD       Future Appointments             In 7 months Francisca, Redell BROCKS, MD [facility] [place]

## 2024-08-04 ENCOUNTER — Ambulatory Visit: Admitting: Family Medicine

## 2024-08-04 ENCOUNTER — Other Ambulatory Visit: Payer: Self-pay | Admitting: Internal Medicine

## 2024-08-04 ENCOUNTER — Encounter: Payer: Self-pay | Admitting: Family Medicine

## 2024-08-04 VITALS — BP 110/72 | HR 93 | Ht 71.0 in | Wt 246.0 lb

## 2024-08-04 DIAGNOSIS — G8929 Other chronic pain: Secondary | ICD-10-CM | POA: Diagnosis not present

## 2024-08-04 DIAGNOSIS — I10 Essential (primary) hypertension: Secondary | ICD-10-CM

## 2024-08-04 DIAGNOSIS — M25562 Pain in left knee: Secondary | ICD-10-CM | POA: Diagnosis not present

## 2024-08-04 DIAGNOSIS — C649 Malignant neoplasm of unspecified kidney, except renal pelvis: Secondary | ICD-10-CM

## 2024-08-04 NOTE — Progress Notes (Signed)
 Established Patient Office Visit  Patient ID: Colton Butler, male    DOB: 11-17-1960  Age: 64 y.o. MRN: [id number] PCP: Colton Rumbold K, MD  Chief Complaint  Patient presents with   Gout   Hypertension    Subjective:     HPI  Discussed the use of AI scribe software for clinical note transcription with the patient, who gave verbal consent to proceed.  History of Present Illness Colton Butler is a 63 year old male with a history of gout who presents for a follow-up visit after a recent gout attack.  He experienced a gout attack last week during a period of high stress and irregular eating patterns while visiting his mother. The attack affected his left leg, primarily the top of the foot, causing significant pain and difficulty walking, necessitating the use of a cane. The area remains sore, though there is no current redness. He was previously treated with prednisone  for a gout attack and is awaiting uric acid level testing to establish a baseline.  He reports ongoing knee pain and states that an x-ray of his knee was performed earlier this year. The pain is primarily located behind the knee and radiates down the shin. He describes the pain as intense at times, with stretching providing temporary relief. He has a history of left knee surgery for a torn meniscus. Despite significant weight loss from 308 pounds in April to 246 pounds currently, attributed to dietary changes and regular exercise including swimming, he continues to experience knee pain.  He has a history of low blood pressure episodes, feeling 'woozy' at times, with blood pressure readings occasionally dropping to 90/60 mmHg. He is currently on lisinopril  10 mg and hydrochlorothiazide  12.5 mg.    Review of Systems  All other systems reviewed and are negative.     Objective:     BP 110/72   Pulse 93   Ht 5' 11 (1.803 m)   Wt 246 lb (111.6 kg)   SpO2 97%   BMI 34.31 kg/m  BP Readings from Last 3 Encounters:   08/04/24 110/72  04/24/24 124/76  03/25/24 (!) 127/92      Physical Exam Vitals and nursing note reviewed.  Constitutional:      Appearance: Normal appearance.  HENT:     Head: Normocephalic.     Right Ear: External ear normal.     Left Ear: External ear normal.  Eyes:     Conjunctiva/sclera: Conjunctivae normal.  Cardiovascular:     Rate and Rhythm: Normal rate.  Pulmonary:     Effort: Pulmonary effort is normal. No respiratory distress.  Abdominal:     Palpations: Abdomen is soft.  Musculoskeletal:        General: Normal range of motion.  Skin:    General: Skin is warm.  Neurological:     Mental Status: He is alert and oriented to person, place, and time.  Psychiatric:        Mood and Affect: Mood normal.     Physical Exam MEASUREMENTS: Weight- 245-246.   No results found for any visits on 08/04/24.     The 10-year ASCVD risk score (Arnett DK, et al., 2019) is: 8.2%    Assessment & Plan:   Problem List Items Addressed This Visit       Cardiovascular and Mediastinum   Essential hypertension - Primary (Chronic)     Genitourinary   Papillary renal cell carcinoma (HCC)     Other   Chronic pain of left  knee   Obesity, morbid (HCC)    Assessment and Plan Assessment & Plan Acute gout flare, left foot Recurrent gout flare in left foot, residual soreness. Discussed uric acid levels and long-term management options. - Prescribe colchicine for six months. - Consider starting allopurinol. - Re-evaluate uric acid levels in two months.  Osteoarthritis, left knee Chronic osteoarthritis with pain exacerbated by weight-bearing. X-ray showed moderate arthritis. - Continue low impact exercises. - Encourage weight loss.  Hypertension, currently managed Hypertension managed with lisinopril  and hydrochlorothiazide . Recent weight loss led to low blood pressure episodes. - Discontinue hydrochlorothiazide . - Continue lisinopril  10 mg daily. - Monitor blood  pressure regularly. - Reassess medication regimen if symptoms persist.  Obesity Obesity with significant weight loss. Current weight 246 lbs, BMI remains high. - Continue current diet and exercise regimen. - Aim for further weight loss to reach BMI of 25. - Encourage swimming as a low-impact exercise.  Renal cell carcinoma, status post left kidney ablation Status post left kidney ablation, regular follow-up with clear CT scans. - Monitor renal function with periodic creatinine levels.    No follow-ups on file.    Colton Butler Kayler Buckholtz, MD [facility] Primary Care & Sports Medicine at [facility]

## 2024-08-04 NOTE — Patient Instructions (Signed)
   We hope you enjoyed your visit with our office! Your feedback means so much to our team, and it helps us  to continue providing the best care possible. If you had a positive experience, we'd love if you could share it by leaving us  a [employer] Review and also completing our patient survey that you'll receive soon.  Your kind words not only brighten our day but also help other patients feel confident in choosing our office for their care.  Thank you for being a part of our practice family!   Dr. [facility]  Primary [facility] & [facility] Medicine [facility] [place] [address]  [place] [address] Office [phone]  Fax: [phone]'

## 2024-08-05 NOTE — Telephone Encounter (Signed)
 Requested medications are due for refill today.  yes  Requested medications are on the active medications list.  yes  Last refill. 11/13/2023 #90 1 rf  Future visit scheduled.   yes  Notes to clinic.  Labs are expired.    Requested Prescriptions  Pending Prescriptions Disp Refills   lisinopril  (ZESTRIL ) 10 MG tablet [Pharmacy Med Name: Lisinopril  10 MG Oral Tablet] 90 tablet 0    Sig: Take 1 tablet by mouth once daily     Cardiovascular:  ACE Inhibitors Failed - 08/05/2024  3:22 PM      Failed - Cr in normal range and within 180 days    Creatinine, Ser  Date Value Ref Range Status  11/13/2023 1.22 0.76 - 1.27 mg/dL Final         Failed - K in normal range and within 180 days    Potassium  Date Value Ref Range Status  11/13/2023 4.9 3.5 - 5.2 mmol/L Final         Passed - Patient is not pregnant      Passed - Last BP in normal range    BP Readings from Last 1 Encounters:  08/04/24 110/72         Passed - Valid encounter within last 6 months    Recent Outpatient Visits           Yesterday Essential hypertension   [place] [facility] at [facility], Vinay K, MD   3 months ago Acute gout of left foot, unspecified cause   [place] [facility] at [facility], Vinay K, MD   8 months ago Annual physical exam   [facility] at [facility], Leita DEL, MD       Future Appointments             In 7 months Francisca, Redell BROCKS, MD [facility] [place]

## 2024-09-13 ENCOUNTER — Other Ambulatory Visit: Payer: Self-pay | Admitting: Internal Medicine

## 2024-09-13 DIAGNOSIS — R1011 Right upper quadrant pain: Secondary | ICD-10-CM

## 2024-09-16 NOTE — Telephone Encounter (Signed)
 Requested Prescriptions  Pending Prescriptions Disp Refills   gabapentin  (NEURONTIN ) 100 MG capsule [Pharmacy Med Name: Gabapentin  100 MG Oral Capsule] 90 capsule 0    Sig: Take 1 capsule by mouth at bedtime     Neurology: Anticonvulsants - gabapentin  Passed - 09/16/2024 12:18 PM      Passed - Cr in normal range and within 360 days    Creatinine, Ser  Date Value Ref Range Status  11/13/2023 1.22 0.76 - 1.27 mg/dL Final         Passed - Completed PHQ-2 or PHQ-9 in the last 360 days      Passed - Valid encounter within last 12 months    Recent Outpatient Visits           1 month ago Essential hypertension   [place] [facility] at [facility], Vinay K, MD   4 months ago Acute gout of left foot, unspecified cause   [facility] at [facility], Vinay K, MD   10 months ago Annual physical exam   [facility] at [facility], Leita DEL, MD       Future Appointments             In 5 months Francisca, Redell BROCKS, MD [facility] [place]

## 2024-10-06 ENCOUNTER — Encounter: Payer: Self-pay | Admitting: Family Medicine

## 2024-10-06 ENCOUNTER — Ambulatory Visit: Admitting: Family Medicine

## 2024-10-06 VITALS — BP 122/78 | HR 87 | Ht 71.0 in | Wt 239.0 lb

## 2024-10-06 DIAGNOSIS — I10 Essential (primary) hypertension: Secondary | ICD-10-CM

## 2024-10-06 DIAGNOSIS — M1A1720 Lead-induced chronic gout, left ankle and foot, without tophus (tophi): Secondary | ICD-10-CM

## 2024-10-06 DIAGNOSIS — T560X1D Toxic effect of lead and its compounds, accidental (unintentional), subsequent encounter: Secondary | ICD-10-CM

## 2024-10-06 DIAGNOSIS — I700 Atherosclerosis of aorta: Secondary | ICD-10-CM

## 2024-10-06 DIAGNOSIS — E782 Mixed hyperlipidemia: Secondary | ICD-10-CM | POA: Diagnosis not present

## 2024-10-06 DIAGNOSIS — C649 Malignant neoplasm of unspecified kidney, except renal pelvis: Secondary | ICD-10-CM | POA: Diagnosis not present

## 2024-10-06 MED ORDER — LISINOPRIL 10 MG PO TABS: 10.0000 mg | ORAL_TABLET | Freq: Every day | ORAL | 1 refills | Status: AC

## 2024-10-06 MED ORDER — ATORVASTATIN CALCIUM 10 MG PO TABS: 10.0000 mg | ORAL_TABLET | Freq: Every day | ORAL | 1 refills | Status: AC

## 2024-10-06 NOTE — Progress Notes (Signed)
 "  Established Patient Office Visit  Patient ID: Colton Butler, male    DOB: [birth year]  Age: 64 y.o. MRN: [id number] PCP: Colton Harts K, MD  Chief Complaint  Patient presents with   Gout    Has improved    Subjective:     HPI  Discussed the use of AI scribe software for clinical note transcription with the patient, who gave verbal consent to proceed.  History of Present Illness Colton Butler is a 64 year old male with hypertension and gout who presents for follow-up. He is accompanied by his wife.  He manages his hypertension with daily exercise and a 10 mg dose of lisinopril . He is mindful of his salt intake, preferring not to use salt substitutes like Mrs. Dash. He has lost 7 pounds since his last visit, now weighing 239 pounds. No dizziness or lightheadedness.  Regarding his gout, he has not experienced any attacks since his last visit. Previously, his gout affected the left toe and progressed further up his left foot. He is not currently taking colchicine  but has used it in the past during attacks. He takes tart cherry supplements for gout management.  He is on Lipitor for cholesterol management, which is going well. He also takes gabapentin  at night for nerve pain, iron tablets, multivitamins, omeprazole, B12, calcium  citrate, and cetirizine for allergies.  He has a history of renal cell carcinoma and is undergoing regular follow-ups with serial CT scans. His last scan was in June, and he is due for another in June this year. His prostate levels, cholesterol, and kidney function were checked 10 months ago.  He mentions a change in [place] to [place] starting in March, which may affect the timing of his lab work.     Review of Systems  All other systems reviewed and are negative.     Objective:     BP 122/78   Pulse 87   Ht 5' 11 (1.803 m)   Wt 239 lb (108.4 kg)   SpO2 98%   BMI 33.33 kg/m  BP Readings from Last 3 Encounters:  10/06/24 122/78   08/04/24 110/72  04/24/24 124/76   Wt Readings from Last 3 Encounters:  10/06/24 239 lb (108.4 kg)  08/04/24 246 lb (111.6 kg)  04/24/24 264 lb 8 oz (120 kg)      Physical Exam Vitals and nursing note reviewed.  Constitutional:      Appearance: Normal appearance.  HENT:     Head: Normocephalic.     Right Ear: External ear normal.     Left Ear: External ear normal.  Eyes:     Conjunctiva/sclera: Conjunctivae normal.  Cardiovascular:     Rate and Rhythm: Normal rate.  Pulmonary:     Effort: Pulmonary effort is normal. No respiratory distress.  Abdominal:     Palpations: Abdomen is soft.  Musculoskeletal:        General: Normal range of motion.  Skin:    General: Skin is warm.  Neurological:     Mental Status: He is alert and oriented to person, place, and time.  Psychiatric:        Mood and Affect: Mood normal.     Physical Exam VITALS: BP- 120/78 MEASUREMENTS: Weight- 239.   No results found for any visits on 10/06/24.     The 10-year ASCVD risk score (Arnett DK, et al., 2019) is: 9.8%    Assessment & Plan:   Problem List Items Addressed This Visit   None  Assessment and Plan Assessment & Plan Chronic gout of left foot No recent attacks. Discussed colchicine  and allopurinol  for management and prevention. Allopurinol  dose to increase gradually with concurrent colchicine  use. - Checked uric acid levels. - Start colchicine  and allopurinol  if uric acid > 6 mg/dL. - Increase allopurinol  from 100 mg to 300 mg over six months. - Use colchicine  prophylactically when starting allopurinol .  Essential hypertension Blood pressure controlled at 120/78 mmHg on lisinopril  10 mg daily. No dizziness or lightheadedness. - Continue lisinopril  10 mg daily. - Maintain low-salt diet.  Mixed hyperlipidemia Cholesterol levels normal ten months ago. - Continue atorvastatin  (Lipitor).  Papillary renal cell carcinoma, status post left kidney ablation Monitored with  serial CT scans. Next appointment with Dr. Charlsie in June. - Continue monitoring with serial CT scans as scheduled.  Morbid obesity Weight decreased from 246 lbs to 239 lbs. Engaging in regular exercise and dietary modifications. - Continue regular exercise and dietary modifications.    No follow-ups on file.    Colton Butler Kindal Ponti, MD [facility] at [facility]   "

## 2024-10-07 DIAGNOSIS — T560X1D Toxic effect of lead and its compounds, accidental (unintentional), subsequent encounter: Secondary | ICD-10-CM

## 2024-10-07 LAB — URIC ACID: Uric Acid: 8.3 mg/dL (ref 3.8–8.4)

## 2024-10-07 MED ORDER — ALLOPURINOL 300 MG PO TABS: 300.0000 mg | ORAL_TABLET | Freq: Every day | ORAL | 0 refills | Status: AC

## 2024-10-07 MED ORDER — COLCHICINE 0.6 MG PO TABS: 0.6000 mg | ORAL_TABLET | Freq: Every day | ORAL | 0 refills | Status: AC

## 2024-10-07 MED ORDER — ALLOPURINOL 100 MG PO TABS: 100.0000 mg | ORAL_TABLET | Freq: Every day | ORAL | 0 refills | Status: AC

## 2025-02-02 ENCOUNTER — Ambulatory Visit: Admitting: Family Medicine

## 2025-03-10 ENCOUNTER — Ambulatory Visit: Admitting: Urology

## 2025-03-11 ENCOUNTER — Ambulatory Visit: Admitting: Urology

## 2025-04-07 ENCOUNTER — Ambulatory Visit: Admitting: Family Medicine
